# Patient Record
Sex: Male | Born: 1955
Health system: Southern US, Community
[De-identification: ages and names within clinical notes are randomized; demographics above are authoritative.]

## PROBLEM LIST (undated history)

## (undated) DIAGNOSIS — G479 Sleep disorder, unspecified: Secondary | ICD-10-CM

## (undated) DIAGNOSIS — K2270 Barrett's esophagus without dysplasia: Secondary | ICD-10-CM

## (undated) DIAGNOSIS — D126 Benign neoplasm of colon, unspecified: Secondary | ICD-10-CM

## (undated) DIAGNOSIS — B019 Varicella without complication: Secondary | ICD-10-CM

## (undated) DIAGNOSIS — M25569 Pain in unspecified knee: Secondary | ICD-10-CM

## (undated) DIAGNOSIS — K219 Gastro-esophageal reflux disease without esophagitis: Secondary | ICD-10-CM

## (undated) DIAGNOSIS — E669 Obesity, unspecified: Secondary | ICD-10-CM

## (undated) HISTORY — DX: Pain in unspecified knee: M25.569

## (undated) HISTORY — DX: Gastro-esophageal reflux disease without esophagitis: K21.9

## (undated) HISTORY — PX: COLONOSCOPY: SHX174

## (undated) HISTORY — PX: OTHER SURGICAL HISTORY: SHX169

## (undated) HISTORY — DX: Obesity, unspecified: E66.9

## (undated) HISTORY — PX: UPPER GASTROINTESTINAL ENDOSCOPY: SHX188

## (undated) HISTORY — DX: Varicella without complication: B01.9

## (undated) HISTORY — DX: Barrett's esophagus without dysplasia: K22.70

## (undated) HISTORY — PX: WISDOM TOOTH EXTRACTION: SHX21

## (undated) HISTORY — PX: TONSILLECTOMY: SUR1361

## (undated) HISTORY — PX: APPENDECTOMY: SHX54

## (undated) HISTORY — DX: Benign neoplasm of colon, unspecified: D12.6

## (undated) HISTORY — DX: Sleep disorder, unspecified: G47.9

---

## 1993-11-21 HISTORY — PX: VASECTOMY: SHX75

## 2011-07-14 ENCOUNTER — Telehealth: Payer: Self-pay | Admitting: *Deleted

## 2011-07-14 DIAGNOSIS — Z0000 Encounter for general adult medical examination without abnormal findings: Secondary | ICD-10-CM

## 2011-07-14 DIAGNOSIS — Z125 Encounter for screening for malignant neoplasm of prostate: Secondary | ICD-10-CM

## 2011-07-14 NOTE — Telephone Encounter (Signed)
Received staff msg pt schedule for CPX 08/24/11. Need CPX labs entered in EPIC. Entered labs...07/14/11@11 :26am/LMB

## 2011-08-17 ENCOUNTER — Other Ambulatory Visit (INDEPENDENT_AMBULATORY_CARE_PROVIDER_SITE_OTHER): Payer: Self-pay

## 2011-08-17 DIAGNOSIS — Z0000 Encounter for general adult medical examination without abnormal findings: Secondary | ICD-10-CM

## 2011-08-17 DIAGNOSIS — Z125 Encounter for screening for malignant neoplasm of prostate: Secondary | ICD-10-CM

## 2011-08-17 LAB — URINALYSIS, ROUTINE W REFLEX MICROSCOPIC
Bilirubin Urine: NEGATIVE
Hgb urine dipstick: NEGATIVE
Ketones, ur: NEGATIVE
Leukocytes, UA: NEGATIVE
Nitrite: NEGATIVE
Urobilinogen, UA: 0.2 (ref 0.0–1.0)
pH: 6 (ref 5.0–8.0)

## 2011-08-17 LAB — CBC WITH DIFFERENTIAL/PLATELET
Basophils Absolute: 0 10*3/uL (ref 0.0–0.1)
Eosinophils Relative: 2.8 % (ref 0.0–5.0)
Lymphs Abs: 2 10*3/uL (ref 0.7–4.0)
MCV: 96.3 fl (ref 78.0–100.0)
Monocytes Absolute: 0.7 10*3/uL (ref 0.1–1.0)
Monocytes Relative: 7.9 % (ref 3.0–12.0)
Neutrophils Relative %: 66.6 % (ref 43.0–77.0)
Platelets: 181 10*3/uL (ref 150.0–400.0)
RDW: 12.6 % (ref 11.5–14.6)
WBC: 8.7 10*3/uL (ref 4.5–10.5)

## 2011-08-17 LAB — HEPATIC FUNCTION PANEL
ALT: 53 U/L (ref 0–53)
AST: 24 U/L (ref 0–37)
Albumin: 4.1 g/dL (ref 3.5–5.2)
Alkaline Phosphatase: 80 U/L (ref 39–117)

## 2011-08-17 LAB — TSH: TSH: 1.08 u[IU]/mL (ref 0.35–5.50)

## 2011-08-17 LAB — LIPID PANEL
HDL: 43.4 mg/dL (ref >39.00)
Triglycerides: 179 mg/dL — ABNORMAL HIGH (ref 0.0–149.0)

## 2011-08-24 ENCOUNTER — Ambulatory Visit (INDEPENDENT_AMBULATORY_CARE_PROVIDER_SITE_OTHER): Payer: BC Managed Care – PPO | Admitting: Internal Medicine

## 2011-08-24 ENCOUNTER — Encounter: Payer: Self-pay | Admitting: Internal Medicine

## 2011-08-24 ENCOUNTER — Ambulatory Visit (INDEPENDENT_AMBULATORY_CARE_PROVIDER_SITE_OTHER)
Admission: RE | Admit: 2011-08-24 | Discharge: 2011-08-24 | Disposition: A | Discharge status: Home / Self Care | Payer: BC Managed Care – PPO | Source: Ambulatory Visit | Attending: Internal Medicine | Admitting: Internal Medicine

## 2011-08-24 VITALS — BP 122/76 | HR 62 | Temp 98.3°F | Wt 225.0 lb

## 2011-08-24 DIAGNOSIS — E669 Obesity, unspecified: Secondary | ICD-10-CM

## 2011-08-24 DIAGNOSIS — R1310 Dysphagia, unspecified: Secondary | ICD-10-CM

## 2011-08-24 DIAGNOSIS — Z23 Encounter for immunization: Secondary | ICD-10-CM

## 2011-08-24 DIAGNOSIS — Z136 Encounter for screening for cardiovascular disorders: Secondary | ICD-10-CM

## 2011-08-24 DIAGNOSIS — Z87891 Personal history of nicotine dependence: Secondary | ICD-10-CM | POA: Insufficient documentation

## 2011-08-24 DIAGNOSIS — G479 Sleep disorder, unspecified: Secondary | ICD-10-CM

## 2011-08-24 DIAGNOSIS — F17200 Nicotine dependence, unspecified, uncomplicated: Secondary | ICD-10-CM

## 2011-08-24 DIAGNOSIS — Z0000 Encounter for general adult medical examination without abnormal findings: Secondary | ICD-10-CM

## 2011-08-24 DIAGNOSIS — M25569 Pain in unspecified knee: Secondary | ICD-10-CM

## 2011-08-24 MED ORDER — TETANUS-DIPHTH-ACELL PERTUSSIS 5-2.5-18.5 LF-MCG/0.5 IM SUSP: 0.5000 mL | Freq: Once | INTRAMUSCULAR | Status: DC

## 2011-08-24 NOTE — Progress Notes (Signed)
Subjective:    Patient ID: Terrance Ortiz, male    DOB: [birth year], 55 y.o.   MRN: [id number]  HPI Terrance Ortiz is here to reestablish for on-going continuity care. He is doing well, just needs a post-warranty going over. He reports that sleep has been a problem: sleep duration issues. Generally this is due to work related issues.  Past Medical History  Diagnosis Date  . Varicella   . Knee pain     recurrent intermittent tendonitis  . Overweight for pediatric patient   . Sleep disorder    Past Surgical History  Procedure Date  . Fracture collar bone   . Vasectomy 1995   Family History  Problem Relation Age of Onset  . Cancer Father     esophageal cancer-had mets but survived 15 yrs  . Diabetes Neg Hx   . Hypertension Neg Hx   . Hyperlipidemia Neg Hx   . Heart disease Neg Hx    History   Social History  . Marital Status: Married    Spouse Name: N/A    Number of Children: 2  . Years of Education: 16   Occupational History  . real estate    Social History Main Topics  . Smoking status: Former Smoker -- 0.3 packs/day for 20 years    Quit date: 01/11/2011  . Smokeless tobacco: Never Used  . Alcohol Use: 7.5 oz/week    15 drink(s) per week  . Drug Use: No  . Sexually Active: Yes -- Male partner(s)   Other Topics Concern  . 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 BS- Bus Admin. Married - '84. 1 dtr - '94, 1 son '93. 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 Do you drink Alcoholic Beverages-yes. Do you drink caffienated Beverages-no. Do you use seatbelt often-yes. Do you exercise at least 3 times a week-no. Is there a smoke alarm in your house-yes. Have you experienced physical abuse-noUsual # of hours of sleep per night 6-7       Review of Systems Constitutional:  Negative for fever, chills, activity change and unexpected weight change.  HEENT:  Negative for hearing loss, ear pain,  congestion, neck stiffness and postnasal drip. Negative for sore throat or swallowing problems. Negative for dental complaints.   Eyes: Negative for vision loss or change in visual acuity.  Respiratory: Negative for chest tightness and wheezing. Negative for DOE.   Cardiovascular: Negative for chest pain or palpitations. No decreased exercise tolerance Gastrointestinal: No change in bowel habit. No bloating or gas. No reflux or indigestion Genitourinary: Negative for urgency, frequency, flank pain and difficulty urinating.  Musculoskeletal: Negative for myalgias, back pain, arthralgias and gait problem.  Neurological: Negative for dizziness, tremors, weakness and headaches.  Hematological: Negative for adenopathy.  Psychiatric/Behavioral: Negative for behavioral problems and dysphoric mood.       Objective:   Physical Exam Vitals noted and normal. Gen'l- overweight white man in no distress HEENT - [place]/AT, C&S clear, PERRLA, fundi - normal with handheld instrument. Oropharynx with native dentition in good repair, no buccal or palatal lesions, posterior pharynx clear. Neck - supple, no thyromegaly Node - none Chest - Nop CVAT, no deformity Lungs - Clear to exam Cor - 2+ radial and DP pulses, quiet precordium, RRR w/o murmur, rub or gallop. No JVD, no carotid bruits Abdomen - obese, soft, no guarding or rebound, no organomegaly Genitalia deferred Rectal deferred to  normal PSA Ext- no deformity, Moves all extremities normally. Neuro - non focal exam, DTRs 2+ and symmetrical, normal motor strength, normal gait and balance Skin - clear  Lab Results  Component Value Date   WBC 8.7 08/17/2011   HGB 15.9 08/17/2011   HCT 46.9 08/17/2011   PLT 181.0 08/17/2011   CHOL 153 08/17/2011   TRIG 179.0* 08/17/2011   HDL 43.40 08/17/2011   LDLCALC 74 08/17/2011   ALT 53 08/17/2011   AST 24 08/17/2011   TSH 1.08 08/17/2011   PSA 0.42 08/17/2011          Assessment & Plan:

## 2011-08-26 ENCOUNTER — Encounter: Payer: Self-pay | Admitting: Internal Medicine

## 2011-08-26 DIAGNOSIS — E669 Obesity, unspecified: Secondary | ICD-10-CM | POA: Insufficient documentation

## 2011-08-26 DIAGNOSIS — G479 Sleep disorder, unspecified: Secondary | ICD-10-CM | POA: Insufficient documentation

## 2011-08-26 DIAGNOSIS — Z0000 Encounter for general adult medical examination without abnormal findings: Secondary | ICD-10-CM | POA: Insufficient documentation

## 2011-08-26 DIAGNOSIS — R1310 Dysphagia, unspecified: Secondary | ICD-10-CM | POA: Insufficient documentation

## 2011-08-26 DIAGNOSIS — M25569 Pain in unspecified knee: Secondary | ICD-10-CM | POA: Insufficient documentation

## 2011-08-26 NOTE — Assessment & Plan Note (Addendum)
Discussed weight management: smart food choices among existing preferences; PORTION SIZE control - use of the hand as a gauge for meals and snacks, meal of 1,000 chews; regular aerobic exercise - 30 minutes 3 times a week minimum with target heart rate of 130+. Target weight 180 lbs; goal - loose 1-2 lbs per month.

## 2011-08-26 NOTE — Assessment & Plan Note (Addendum)
Recently quit smoking. Doing OK.  Plan - routine follow up chest x-ray  Addendum: Clinical Data: Tobacco abuse  CHEST - 2 VIEW  Comparison: None.  Findings: Normal mediastinum and cardiac silhouette. No effusion,  infiltrate, or pneumothorax no osseous abnormality.  IMPRESSION:  No acute cardiopulmonary process. No radiographic evidence of  pulmonary nodule.  Original Report Authenticated By: Genevive Bi, M.D.

## 2011-08-26 NOTE — Assessment & Plan Note (Signed)
Patient h/o heartburn for which he has been taking prn pepcid. He also has a h/o smoking and drinking and a family h/o esophageal cancer. He has stopped smoking.  Plan - continue smoking cessation           Take H2 blocker, pepcid, twice a day routinely           Referral to Dr. Russella Dar

## 2011-08-26 NOTE — Assessment & Plan Note (Signed)
Sleep duration insomnia due to delay in processing daytime activities. Plan - sleep hygiene approach: regular hours to retire and rise 7 days/week; avoidance of stimulants including sugar, chocolate and alcohol; regular exercise but at least 4 hours before retiring; sleep sanctuary concept; avoidance of extinction behaviors - particularly laying in bed awake; structured contemplative time prior to retiring.

## 2011-08-26 NOTE — Assessment & Plan Note (Signed)
Interval h/o last 8 years unremarkable.His physical exam is normal except for weight. Lab results are in normal range with excellent lipids. He is due for colorectal cancer screening and will be referred to Dr. Russella Dar for consultation for mild dysphagia and need for colorectal cancer screening. PSS was normal - he is advised to have repeat study in 3 years if he chooses to have screening. Due for Tdap. 12 lead EKG is normal with no evidence of ischemia or injury  In summary - a very nice man who is investing in his health with smoking cessation and reduced alcohol intake. He is committed to weight management and increased exercise. He will be referred to GI for evaluation. He is asked to return as needed or in 6 months for follow-up of weight management and sleep disorder.

## 2011-09-21 ENCOUNTER — Encounter: Payer: Self-pay | Admitting: Gastroenterology

## 2011-09-21 ENCOUNTER — Ambulatory Visit (INDEPENDENT_AMBULATORY_CARE_PROVIDER_SITE_OTHER): Payer: BC Managed Care – PPO | Admitting: Gastroenterology

## 2011-09-21 VITALS — BP 120/74 | HR 88 | Ht 71.0 in | Wt 225.0 lb

## 2011-09-21 DIAGNOSIS — K219 Gastro-esophageal reflux disease without esophagitis: Secondary | ICD-10-CM

## 2011-09-21 DIAGNOSIS — Z1211 Encounter for screening for malignant neoplasm of colon: Secondary | ICD-10-CM

## 2011-09-21 MED ORDER — PEG-KCL-NACL-NASULF-NA ASC-C 100 G PO SOLR: 1.0000 | Freq: Once | ORAL | Status: DC

## 2011-09-21 NOTE — Patient Instructions (Signed)
You have been scheduled for a Upper Endoscopy/ Colonoscopy. See separate instructions.  Pick up your prep kit from your pharmacy.  Patient advised to avoid spicy, acidic, citrus, chocolate, mints, fruit and fruit juices.  Limit the intake of caffeine, alcohol and Soda.  Don't exercise too soon after eating.  Don't lie down within 3-4 hours of eating.  Elevate the head of your bed. cc: Illene Regulus, MD

## 2011-09-21 NOTE — Progress Notes (Signed)
History of Present Illness: This is a 55 year old male relates intermittent reflux symptoms for the past few years. He states symptoms are brought on by certain foods and generally respond well to Pepcid AC. He was advised to use Pepcid AC 2 daily on a regular basis to prevent reflux but has not done so since his symptoms only occur once every week or two. He states that occasionally when he is eating very fast his food will go down slowly. Denies weight loss, abdominal pain, constipation, diarrhea, change in stool caliber, melena, hematochezia, nausea, vomiting, chest pain.  Review of Systems: Pertinent positive and negative review of systems were noted in the above HPI section. All other review of systems were otherwise negative.  Current Medications, Allergies, Past Medical History, Past Surgical History, Family History and Social History were reviewed in [employer] record.  Physical Exam: General: Well developed , well nourished, no acute distress Head: Normocephalic and atraumatic Eyes:  sclerae anicteric, EOMI Ears: Normal auditory acuity Mouth: No deformity or lesions Neck: Supple, no masses or thyromegaly Lungs: Clear throughout to auscultation Heart: Regular rate and rhythm; no murmurs, rubs or bruits Abdomen: Soft, non tender and non distended. No masses, hepatosplenomegaly or hernias noted. Normal Bowel sounds Rectal: Deferred  Musculoskeletal: Symmetrical with no gross deformities  Skin: No lesions on visible extremities Pulses:  Normal pulses noted Extremities: No clubbing, cyanosis, edema or deformities noted Neurological: Alert oriented x 4, grossly nonfocal Cervical Nodes:  No significant cervical adenopathy Inguinal Nodes: No significant inguinal adenopathy Psychological:  Alert and cooperative. Normal mood and affect  Assessment and Recommendations:  1. GERD. Symptoms are intermittent. He has possible dysphagia symptoms as well. Begin all standard  antireflux measures and continue Pepcid AC 1-2 daily as needed. Rule out erosive esophagitis and Barrett's esophagus. The risks, benefits, and alternatives to endoscopy with possible biopsy and possible dilation were discussed with the patient and they consent to proceed.   2. Colorectal cancer screening. The risks, benefits, and alternatives to colonoscopy with possible biopsy and possible polypectomy were discussed with the patient and they consent to proceed.

## 2011-09-22 DIAGNOSIS — D126 Benign neoplasm of colon, unspecified: Secondary | ICD-10-CM

## 2011-09-22 HISTORY — DX: Benign neoplasm of colon, unspecified: D12.6

## 2011-10-17 ENCOUNTER — Ambulatory Visit (AMBULATORY_SURGERY_CENTER): Payer: BC Managed Care – PPO | Admitting: Gastroenterology

## 2011-10-17 ENCOUNTER — Encounter: Payer: Self-pay | Admitting: Gastroenterology

## 2011-10-17 DIAGNOSIS — K219 Gastro-esophageal reflux disease without esophagitis: Secondary | ICD-10-CM

## 2011-10-17 DIAGNOSIS — Z1211 Encounter for screening for malignant neoplasm of colon: Secondary | ICD-10-CM

## 2011-10-17 DIAGNOSIS — D126 Benign neoplasm of colon, unspecified: Secondary | ICD-10-CM

## 2011-10-17 DIAGNOSIS — K2270 Barrett's esophagus without dysplasia: Secondary | ICD-10-CM

## 2011-10-17 MED ORDER — SODIUM CHLORIDE 0.9 % IV SOLN: 500.0000 mL | INTRAVENOUS | Status: DC

## 2011-10-17 NOTE — Progress Notes (Signed)
Patient did not experience any of the following events: a burn prior to discharge; a fall within the facility; wrong site/side/patient/procedure/implant event; or a hospital transfer or hospital admission upon discharge from the facility. (G8907) Patient did not have preoperative order for IV antibiotic SSI prophylaxis. (G8918)  

## 2011-10-17 NOTE — Patient Instructions (Signed)
Please read the handouts given to you by the your recovery room nurse.   Your polyp reports will be mailed to you within two weeks.  You may resume your routine medications today.  Please call us if you have any questions at [phone]. Thank-you.

## 2011-10-18 ENCOUNTER — Telehealth: Payer: Self-pay | Admitting: *Deleted

## 2011-10-18 NOTE — Telephone Encounter (Signed)
Left message on number given in admitting yest with the ok to leave a message, ewm

## 2011-10-27 ENCOUNTER — Encounter: Payer: Self-pay | Admitting: Gastroenterology

## 2011-11-04 ENCOUNTER — Telehealth: Payer: Self-pay | Admitting: Gastroenterology

## 2011-11-04 MED ORDER — OMEPRAZOLE 20 MG PO CPDR: 20.0000 mg | DELAYED_RELEASE_CAPSULE | Freq: Every day | ORAL | Status: DC

## 2011-11-04 NOTE — Telephone Encounter (Signed)
I have reviewed with the patient the results of EGD.  He reports he does not have omeprazole rx as stated on report.  I have sent an rx to his pharmacy.

## 2012-12-30 ENCOUNTER — Other Ambulatory Visit: Payer: Self-pay | Admitting: Gastroenterology

## 2013-01-04 ENCOUNTER — Other Ambulatory Visit: Payer: Self-pay | Admitting: Gastroenterology

## 2013-01-10 ENCOUNTER — Other Ambulatory Visit: Payer: Self-pay | Admitting: Gastroenterology

## 2013-01-14 ENCOUNTER — Other Ambulatory Visit: Payer: Self-pay

## 2013-01-14 ENCOUNTER — Telehealth: Payer: Self-pay | Admitting: Gastroenterology

## 2013-01-14 MED ORDER — OMEPRAZOLE 20 MG PO CPDR: 20.0000 mg | DELAYED_RELEASE_CAPSULE | Freq: Every day | ORAL | Status: DC

## 2013-01-14 NOTE — Telephone Encounter (Signed)
Patient has not been seen since 09/2011. Left a message for patient letting him know he can get this medication through his PCP or he can get the medication over the counter until the scheduled appt.

## 2013-01-21 ENCOUNTER — Ambulatory Visit: Payer: BC Managed Care – PPO | Admitting: Gastroenterology

## 2013-01-28 ENCOUNTER — Ambulatory Visit: Payer: BC Managed Care – PPO | Admitting: Gastroenterology

## 2013-02-11 ENCOUNTER — Ambulatory Visit: Payer: BC Managed Care – PPO | Admitting: Gastroenterology

## 2013-02-13 ENCOUNTER — Ambulatory Visit (INDEPENDENT_AMBULATORY_CARE_PROVIDER_SITE_OTHER): Payer: BC Managed Care – PPO | Admitting: Gastroenterology

## 2013-02-13 ENCOUNTER — Encounter: Payer: Self-pay | Admitting: Gastroenterology

## 2013-02-13 VITALS — BP 118/70 | HR 79 | Ht 71.0 in | Wt 230.0 lb

## 2013-02-13 DIAGNOSIS — K2270 Barrett's esophagus without dysplasia: Secondary | ICD-10-CM

## 2013-02-13 DIAGNOSIS — Z860100 Personal history of colon polyps, unspecified: Secondary | ICD-10-CM

## 2013-02-13 MED ORDER — OMEPRAZOLE 20 MG PO CPDR: 20.0000 mg | DELAYED_RELEASE_CAPSULE | Freq: Every day | ORAL | Status: DC

## 2013-02-13 NOTE — Progress Notes (Signed)
History of Present Illness: This is a 57 year old male with a history of a short segment of Barrett's esophagus and adenomatous colon polyps. His reflux symptoms are under complete control on omeprazole 20 mg daily. He has no gastrointestinal complaints. Denies weight loss, abdominal pain, constipation, diarrhea, change in stool caliber, melena, hematochezia, nausea, vomiting, dysphagia, reflux symptoms, chest pain.  Current Medications, Allergies, Past Medical History, Past Surgical History, Family History and Social History were reviewed in [employer] record.  Physical Exam: General: Well developed , well nourished, no acute distress Head: Normocephalic and atraumatic Eyes:  sclerae anicteric, EOMI Ears: Normal auditory acuity Mouth: No deformity or lesions Lungs: Clear throughout to auscultation Heart: Regular rate and rhythm; no murmurs, rubs or bruits Abdomen: Soft, non tender and non distended. No masses, hepatosplenomegaly or hernias noted. Normal Bowel sounds Musculoskeletal: Symmetrical with no gross deformities  Pulses:  Normal pulses noted Extremities: No clubbing, cyanosis, edema or deformities noted Neurological: Alert oriented x 4, grossly nonfocal Psychological:  Alert and cooperative. Normal mood and affect  Assessment and Recommendations:  1. Barretts esophagus, short segment. Continue standard antireflux measures long-term and omeprazole 20 mg daily long-term. Surveillance endoscopy recommended November 2015.  2. Personal history of adenomatous colon polyps. Surveillance colonoscopy recommended November 2017.

## 2013-02-13 NOTE — Patient Instructions (Addendum)
We have sent the following medications to your pharmacy for you to pick up at your convenience: Omeprazole   Thank you for choosing Dr. Russella Dar and [facility]

## 2013-10-24 ENCOUNTER — Ambulatory Visit (INDEPENDENT_AMBULATORY_CARE_PROVIDER_SITE_OTHER): Payer: BC Managed Care – PPO | Admitting: Internal Medicine

## 2013-10-24 ENCOUNTER — Ambulatory Visit (INDEPENDENT_AMBULATORY_CARE_PROVIDER_SITE_OTHER)
Admission: RE | Admit: 2013-10-24 | Discharge: 2013-10-24 | Disposition: A | Discharge status: Home / Self Care | Payer: BC Managed Care – PPO | Source: Ambulatory Visit | Attending: Internal Medicine | Admitting: Internal Medicine

## 2013-10-24 ENCOUNTER — Other Ambulatory Visit (INDEPENDENT_AMBULATORY_CARE_PROVIDER_SITE_OTHER): Payer: BC Managed Care – PPO

## 2013-10-24 ENCOUNTER — Encounter: Payer: Self-pay | Admitting: Internal Medicine

## 2013-10-24 VITALS — BP 128/82 | HR 63 | Temp 98.6°F | Ht 71.0 in | Wt 230.0 lb

## 2013-10-24 DIAGNOSIS — E669 Obesity, unspecified: Secondary | ICD-10-CM

## 2013-10-24 DIAGNOSIS — Z0000 Encounter for general adult medical examination without abnormal findings: Secondary | ICD-10-CM

## 2013-10-24 DIAGNOSIS — G479 Sleep disorder, unspecified: Secondary | ICD-10-CM

## 2013-10-24 DIAGNOSIS — Z87891 Personal history of nicotine dependence: Secondary | ICD-10-CM

## 2013-10-24 DIAGNOSIS — F17200 Nicotine dependence, unspecified, uncomplicated: Secondary | ICD-10-CM

## 2013-10-24 LAB — COMPREHENSIVE METABOLIC PANEL
ALT: 74 U/L — ABNORMAL HIGH (ref 0–53)
Alkaline Phosphatase: 64 U/L (ref 39–117)
Creatinine, Ser: 0.8 mg/dL (ref 0.4–1.5)
GFR: 104.78 mL/min (ref >60.00)
Sodium: 139 mEq/L (ref 135–145)
Total Bilirubin: 0.9 mg/dL (ref 0.3–1.2)
Total Protein: 7.2 g/dL (ref 6.0–8.3)

## 2013-10-24 MED ORDER — OMEPRAZOLE 20 MG PO CPDR: 20.0000 mg | DELAYED_RELEASE_CAPSULE | Freq: Every day | ORAL | Status: DC

## 2013-10-24 NOTE — Progress Notes (Signed)
Subjective:    Patient ID: Terrance Ortiz, male    DOB: 22-Oct-1956, 57 y.o.   MRN: [id number]  HPI Terrance Ortiz presents for a general wellness exam. No major illness or surgery, no injuries. He has a "cyst" on the plantar aspect of the left foot at the in-step.   He is current with the dentist; has had eye exam about two years ago. He does exercise - 20 minute work 3 times a week. Healthy diet. Business is better. Family is good.   Past Medical History  Diagnosis Date  . Varicella   . Knee pain     recurrent intermittent tendonitis  . Obesity   . Sleep disorder   . GERD (gastroesophageal reflux disease)   . Barrett's esophagus   . Tubular adenoma of colon 09/2011   Past Surgical History  Procedure Laterality Date  . Fracture collar bone    . Vasectomy  1995   Family History  Problem Relation Age of Onset  . Cancer Father     esophageal cancer-had mets but survived 15 yrs  . Diabetes Neg Hx   . Hypertension Neg Hx   . Hyperlipidemia Neg Hx   . Heart disease Neg Hx   . Colon cancer Neg Hx    History   Social History  . Marital Status: Married    Spouse Name: N/A    Number of Children: 2  . Years of Education: 16   Occupational History  . Real Estate     Social History Main Topics  . Smoking status: Former Smoker -- 0.30 packs/day for 20 years    Quit date: 01/11/2011  . Smokeless tobacco: Never Used  . Alcohol Use: 0.0 oz/week     Comment: case of beer a week   . Drug Use: No  . Sexual Activity: Yes    Partners: Female   Other Topics Concern  . 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 BS- Bus Admin. Married - '84. 1 dtr - '94, 1 son '93. 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             Do you drink Alcoholic Beverages-yes. Do you drink caffienated Beverages-no. Do you use seatbelt often-yes. Do you exercise at least 3 times a week-no. Is there a smoke alarm in your  house-yes. Have you experienced physical abuse-no         Usual # of hours of sleep per night 6-7                            Current Outpatient Prescriptions on File Prior to Visit  Medication Sig Dispense Refill  . Naproxen Sodium (ALEVE PO) Take by mouth as needed.        Marland Kitchen omeprazole (PRILOSEC) 20 MG capsule Take 1 capsule (20 mg total) by mouth daily.  30 capsule  11   No current facility-administered medications on file prior to visit.      Review of Systems Constitutional:  Negative for fever, chills, activity change and unexpected weight change.  HEENT:  Negative for hearing loss, ear pain, congestion, neck stiffness and postnasal drip. Negative for sore throat or swallowing problems. Negative for dental complaints.   Eyes: Negative for vision loss or change in visual acuity.  Respiratory: Negative for chest tightness and wheezing. Negative for DOE.   Cardiovascular: Negative  for chest pain or palpitations. No decreased exercise tolerance Gastrointestinal: No change in bowel habit. No bloating or gas. No reflux or indigestion Genitourinary: Negative for urgency, frequency, flank pain and difficulty urinating.  Musculoskeletal: Negative for myalgias, back pain, arthralgias and gait problem.  Neurological: Negative for dizziness, tremors, weakness and headaches.  Hematological: Negative for adenopathy.  Psychiatric/Behavioral: Negative for behavioral problems and dysphoric mood.       Objective:   Physical Exam        Assessment & Plan:

## 2013-10-24 NOTE — Progress Notes (Signed)
Pre visit review using our clinic review tool, if applicable. No additional management support is needed unless otherwise documented below in the visit note. 

## 2013-10-24 NOTE — Patient Instructions (Signed)
Good to see you. [place] didn't seem to hurt your health!!  Your exam is normal except you could loose a few pounds: Diet management: smart food choices, PORTION SIZE CONTROL, regular exercise. Goal - to loose 1-2 lbs.month. Target weight - 190-200lbs.  Will check blood sugar and renal function today - results will be posted to MyChart. Will also check chest x-ray as a former smoker.  You should see an eye doctor. You should have a follow - up exam in two years.   As I go to barging will put you down to see Dr. Santa Genera.

## 2013-10-26 NOTE — Assessment & Plan Note (Signed)
Taking no medications and doing relatively well, better since the economy has taken an upturn.

## 2013-10-26 NOTE — Assessment & Plan Note (Signed)
Interval history is benign. Physical exam is normal except for mild overweight. Labs reviewed - normal, including A1C 5.6%. Last lipid panel Sept '12 - TC 153 HDL 43 LDL 74. He is current with colorectal cancer screening. Discussed pros and cons of prostate cancer screening (USPHCTF recommendations reviewed and ACU April '13 recommendations) and he defers evaluation at this time with last PSA = 0.42 Sept '12. Immunization is up to date.  In summary - a healthy appearing man who will work on [employer] - he knows that portion size is key. He will return for CPX in 2 years otherwise as needed.

## 2013-10-26 NOTE — Assessment & Plan Note (Addendum)
Diet management: smart food choices, PORTION SIZE CONTROL, regular exercise. Goal - to loose 1-2 lbs.month. Target weight - 200 lbs: 30 lb loss over 2 years.

## 2013-10-26 NOTE — Assessment & Plan Note (Signed)
Doing well except for the rare cigar.

## 2014-07-29 ENCOUNTER — Encounter: Payer: Self-pay | Admitting: Gastroenterology

## 2014-08-22 ENCOUNTER — Encounter: Payer: Self-pay | Admitting: Gastroenterology

## 2014-10-07 ENCOUNTER — Ambulatory Visit (AMBULATORY_SURGERY_CENTER): Payer: Self-pay | Admitting: *Deleted

## 2014-10-07 VITALS — Ht 71.0 in | Wt 217.0 lb

## 2014-10-07 DIAGNOSIS — K2270 Barrett's esophagus without dysplasia: Secondary | ICD-10-CM

## 2014-10-07 NOTE — Progress Notes (Signed)
No egg or soy allergy  No anesthesia or intubation problems per pt  No diet medications taken   

## 2014-10-14 ENCOUNTER — Encounter: Payer: Self-pay | Admitting: Gastroenterology

## 2014-10-21 ENCOUNTER — Encounter: Payer: BC Managed Care – PPO | Admitting: Gastroenterology

## 2014-10-30 ENCOUNTER — Ambulatory Visit (AMBULATORY_SURGERY_CENTER): Payer: BC Managed Care – PPO | Admitting: Gastroenterology

## 2014-10-30 ENCOUNTER — Encounter: Payer: Self-pay | Admitting: Gastroenterology

## 2014-10-30 VITALS — BP 136/89 | HR 68 | Temp 96.3°F | Resp 20 | Ht 71.0 in | Wt 217.0 lb

## 2014-10-30 DIAGNOSIS — K2270 Barrett's esophagus without dysplasia: Secondary | ICD-10-CM

## 2014-10-30 MED ORDER — SODIUM CHLORIDE 0.9 % IV SOLN: 500.0000 mL | INTRAVENOUS | Status: DC

## 2014-10-30 MED ORDER — OMEPRAZOLE 20 MG PO CPDR
20.0000 mg | DELAYED_RELEASE_CAPSULE | Freq: Every day | ORAL | Status: DC
Start: 2014-10-30 — End: 2014-12-09

## 2014-10-30 NOTE — Patient Instructions (Signed)
YOU HAD AN ENDOSCOPIC PROCEDURE TODAY AT THE [facility]: Refer to the procedure report that was given to you for any specific questions about what was found during the examination.  If the procedure report does not answer your questions, please call your gastroenterologist to clarify.  If you requested that your care partner not be given the details of your procedure findings, then the procedure report has been included in a sealed envelope for you to review at your convenience later.  YOU SHOULD EXPECT: Some feelings of bloating in the abdomen. Passage of more gas than usual.  Walking can help get rid of the air that was put into your GI tract during the procedure and reduce the bloating. If you had a lower endoscopy (such as a colonoscopy or flexible sigmoidoscopy) you may notice spotting of blood in your stool or on the toilet paper. If you underwent a bowel prep for your procedure, then you may not have a normal bowel movement for a few days.  DIET: Your first meal following the procedure should be a light meal and then it is ok to progress to your normal diet.  A half-sandwich or bowl of soup is an example of a good first meal.  Heavy or fried foods are harder to digest and may make you feel nauseous or bloated.  Likewise meals heavy in dairy and vegetables can cause extra gas to form and this can also increase the bloating.  Drink plenty of fluids but you should avoid alcoholic beverages for 24 hours.  ACTIVITY: Your care partner should take you home directly after the procedure.  You should plan to take it easy, moving slowly for the rest of the day.  You can resume normal activity the day after the procedure however you should NOT DRIVE or use heavy machinery for 24 hours (because of the sedation medicines used during the test).    SYMPTOMS TO REPORT IMMEDIATELY: A gastroenterologist can be reached at any hour.  During normal business hours, 8:30 AM to 5:00 PM Monday through Friday,  call [phone].  After hours and on weekends, please call the GI answering service at [phone] who will take a message and have the physician on call contact you.    Following upper endoscopy (EGD)  Vomiting of blood or coffee ground material  New chest pain or pain under the shoulder blades  Painful or persistently difficult swallowing  New shortness of breath  Fever of 100F or higher  Black, tarry-looking stools  FOLLOW UP: If any biopsies were taken you will be contacted by phone or by letter within the next 1-3 weeks.  Call your gastroenterologist if you have not heard about the biopsies in 3 weeks.  Our staff will call the home number listed on your records the next business day following your procedure to check on you and address any questions or concerns that you may have at that time regarding the information given to you following your procedure. This is a courtesy call and so if there is no answer at the home number and we have not heard from you through the emergency physician on call, we will assume that you have returned to your regular daily activities without incident.  SIGNATURES/CONFIDENTIALITY: You and/or your care partner have signed paperwork which will be entered into your electronic medical record.  These signatures attest to the fact that that the information above on your After Visit Summary has been reviewed and is understood.  Full   responsibility of the confidentiality of this discharge information lies with you and/or your care-partner.   Resume medications. Information given on  [facility]rett's,[place] with discharge instructions.

## 2014-10-30 NOTE — Op Note (Signed)
[place]  [address] [place], [id number]   ENDOSCOPY PROCEDURE REPORT  PATIENT: Terrance Ortiz, Terrance Ortiz  MR#: [id number] BIRTHDATE: 04/10/1956 , 14  yrs. old GENDER: male ENDOSCOPIST: Ladene Artist, MD, [facility] PROCEDURE DATE:  10/30/2014 PROCEDURE:  EGD w/ biopsy ASA CLASS:     Class II INDICATIONS:  history of Barrett's esophagus. MEDICATIONS: Monitored anesthesia care and Propofol 250 mg IV TOPICAL ANESTHETIC: none DESCRIPTION OF PROCEDURE: After the risks benefits and alternatives of the procedure were thoroughly explained, informed consent was obtained.  The LB [id number] endoscope was introduced through the mouth and advanced to the second portion of the duodenum , Without limitations.  The instrument was slowly withdrawn as the mucosa was fully examined.    ESOPHAGUS: There was a 2cm segment of Barrett's esophagus without dysplasia found in the distal esophagus.  The length of circumferential Barrett's was 1cm ([place] C1).  The length of the Barrett's total was 2 cm ([place] M2). Surveillance (4 quadrant biopsies).   The esophagus was otherwise normal. STOMACH: The mucosa and folds of the stomach appeared normal. DUODENUM: The duodenal mucosa showed no abnormalities in the bulb and 2nd part of the duodenum.  Retroflexed views revealed a small hiatal hernia.     The scope was then withdrawn from the patient and the procedure completed.  COMPLICATIONS: There were no immediate complications.  ENDOSCOPIC IMPRESSION: 1.   Barrett's esophagus in the distal esophagus 2.   Small hiatal hernia 3.   The EGD otherwise appeared normal  RECOMMENDATIONS: 1.  Anti-reflux regimen 2.  Await pathology results 3.  Continue PPI 4.  Repeat EGD in 3 years if no dysplasia  eSigned:  Ladene Artist, MD, [facility] 2014-10-30 03:00:92.330

## 2014-10-30 NOTE — Progress Notes (Signed)
Called to room to assist during endoscopic procedure.  Patient ID and intended procedure confirmed with present staff. Received instructions for my participation in the procedure from the performing physician.  

## 2014-10-30 NOTE — Progress Notes (Signed)
Patient awakening,vss,report to rn 

## 2014-10-31 ENCOUNTER — Telehealth: Payer: Self-pay | Admitting: *Deleted

## 2014-10-31 NOTE — Telephone Encounter (Signed)
No answer, left message to call if questions or concerns. 

## 2014-11-04 ENCOUNTER — Encounter: Payer: Self-pay | Admitting: Gastroenterology

## 2014-11-17 ENCOUNTER — Other Ambulatory Visit: Payer: Self-pay | Admitting: Geriatric Medicine

## 2014-11-17 MED ORDER — OMEPRAZOLE 20 MG PO CPDR: 20.0000 mg | DELAYED_RELEASE_CAPSULE | Freq: Every day | ORAL | Status: DC

## 2014-12-09 ENCOUNTER — Ambulatory Visit (INDEPENDENT_AMBULATORY_CARE_PROVIDER_SITE_OTHER): Payer: BLUE CROSS/BLUE SHIELD | Admitting: Internal Medicine

## 2014-12-09 ENCOUNTER — Encounter: Payer: Self-pay | Admitting: Internal Medicine

## 2014-12-09 VITALS — BP 140/80 | HR 75 | Temp 98.2°F | Resp 14 | Ht 71.0 in | Wt 224.0 lb

## 2014-12-09 DIAGNOSIS — M25579 Pain in unspecified ankle and joints of unspecified foot: Secondary | ICD-10-CM

## 2014-12-09 DIAGNOSIS — K2270 Barrett's esophagus without dysplasia: Secondary | ICD-10-CM | POA: Insufficient documentation

## 2014-12-09 DIAGNOSIS — Z0000 Encounter for general adult medical examination without abnormal findings: Secondary | ICD-10-CM

## 2014-12-09 DIAGNOSIS — Z87891 Personal history of nicotine dependence: Secondary | ICD-10-CM

## 2014-12-09 DIAGNOSIS — M79673 Pain in unspecified foot: Secondary | ICD-10-CM

## 2014-12-09 DIAGNOSIS — E669 Obesity, unspecified: Secondary | ICD-10-CM

## 2014-12-09 NOTE — Assessment & Plan Note (Signed)
Taking omeprazole daily, due repeat EGD 2018, no dysplasia last EGD.

## 2014-12-09 NOTE — Assessment & Plan Note (Signed)
Feel nodule on the bottom of foot and will refer to podiatry for evaluation and treatment.

## 2014-12-09 NOTE — Assessment & Plan Note (Signed)
Quit about 3-4 years ago and has likely >30 pack year history. Will obtain CT low dose chest for screening per his request. We did talk about the possibility of false positive and false negative.

## 2014-12-09 NOTE — Assessment & Plan Note (Signed)
Declines flu shot, due repeat colonoscopy 2017, EGD in 2018. Tdap up to date.

## 2014-12-09 NOTE — Progress Notes (Signed)
   Subjective:    Patient ID: Terrance Ortiz, male    DOB: 05-21-1956, 59 y.o.   MRN: [id number]  HPI The patient is a 59 YO man who is coming in to establish care. He has PMH of obesity, past smoking, GERD.  He recently had follow up of his barrett's esophagus without changes. He has been trying to lose weight and has lost about 20 pounds this last year. He stopped drinking as much beer, increased his exercise, and has been watching his food. He would like to get his foot looked at since it is hurting him with the increased walking. He would like to get a CT chest to screen for lung cancer since several of his friends have been diagnosed with cancer. He states that he quit in 2012 and had been smoking more and less for about 30-40 years. He does likely have at least 30 pack years. He denies chest pains, SOB, abdominal pains.   Review of Systems  Constitutional: Positive for activity change. Negative for fever, chills, appetite change, fatigue and unexpected weight change.       Walking more  HENT: Negative.   Respiratory: Negative for cough, chest tightness, shortness of breath and wheezing.   Cardiovascular: Negative for chest pain, palpitations and leg swelling.  Gastrointestinal: Negative for abdominal pain, diarrhea, constipation and abdominal distention.  Musculoskeletal: Positive for arthralgias. Negative for myalgias and back pain.  Skin: Negative.   Neurological: Negative for dizziness, weakness, light-headedness and headaches.  Psychiatric/Behavioral: Negative.       Objective:   Physical Exam  Constitutional: He is oriented to person, place, and time. He appears well-developed and well-nourished.  overweight  HENT:  Head: Normocephalic and atraumatic.  Eyes: EOM are normal.  Neck: Normal range of motion.  Cardiovascular: Normal rate and regular rhythm.   Pulmonary/Chest: Effort normal and breath sounds normal. No respiratory distress. He has no wheezes. He has no rales.    Abdominal: Soft. Bowel sounds are normal. He exhibits no distension. There is no tenderness. There is no rebound.  Musculoskeletal: He exhibits no edema.  Neurological: He is alert and oriented to person, place, and time. Coordination normal.  Skin: Skin is warm and dry.    Filed Vitals:   12/09/14 0852  BP: 140/80  Pulse: 75  Temp: 98.2 F (36.8 C)  TempSrc: Oral  Resp: 14  Height: 5\' 11"  (1.803 m)  Weight: 224 lb (101.606 kg)  SpO2: 98%      Assessment & Plan:

## 2014-12-09 NOTE — Patient Instructions (Signed)
We will get a low dose CT scan of the chest to check for any problems. We will send you to a foot doctor to look at your foot.   We are not going to check any blood work today.  Keep up the good work with diet and exercise to lose weight. Every 1 pound lost takes 4 pounds of pressure off your knees.   Health Maintenance A healthy lifestyle and preventative care can promote health and wellness.  Maintain regular health, dental, and eye exams.  Eat a healthy diet. Foods like vegetables, fruits, whole grains, low-fat dairy products, and lean protein foods contain the nutrients you need and are low in calories. Decrease your intake of foods high in solid fats, added sugars, and salt. Get information about a proper diet from your health care provider, if necessary.  Regular physical exercise is one of the most important things you can do for your health. Most adults should get at least 150 minutes of moderate-intensity exercise (any activity that increases your heart rate and causes you to sweat) each week. In addition, most adults need muscle-strengthening exercises on 2 or more days a week.   Maintain a healthy weight. The body mass index (BMI) is a screening tool to identify possible weight problems. It provides an estimate of body fat based on height and weight. Your health care provider can find your BMI and can help you achieve or maintain a healthy weight. For males 20 years and older:  A BMI below 18.5 is considered underweight.  A BMI of 18.5 to 24.9 is normal.  A BMI of 25 to 29.9 is considered overweight.  A BMI of 30 and above is considered obese.  Maintain normal blood lipids and cholesterol by exercising and minimizing your intake of saturated fat. Eat a balanced diet with plenty of fruits and vegetables. Blood tests for lipids and cholesterol should begin at age 108 and be repeated every 5 years. If your lipid or cholesterol levels are high, you are over age 7, or you are at high  risk for heart disease, you may need your cholesterol levels checked more frequently.Ongoing high lipid and cholesterol levels should be treated with medicines if diet and exercise are not working.  If you smoke, find out from your health care provider how to quit. If you do not use tobacco, do not start.  Lung cancer screening is recommended for adults aged 40-80 years who are at high risk for developing lung cancer because of a history of smoking. A yearly low-dose CT scan of the lungs is recommended for people who have at least a 30-pack-year history of smoking and are current smokers or have quit within the past 15 years. A pack year of smoking is smoking an average of 1 pack of cigarettes a day for 1 year (for example, a 30-pack-year history of smoking could mean smoking 1 pack a day for 30 years or 2 packs a day for 15 years). Yearly screening should continue until the smoker has stopped smoking for at least 15 years. Yearly screening should be stopped for people who develop a health problem that would prevent them from having lung cancer treatment.  If you choose to drink alcohol, do not have more than 2 drinks per day. One drink is considered to be 12 oz (360 mL) of beer, 5 oz (150 mL) of wine, or 1.5 oz (45 mL) of liquor.  Avoid the use of street drugs. Do not share needles with anyone.  Ask for help if you need support or instructions about stopping the use of drugs.  High blood pressure causes heart disease and increases the risk of stroke. Blood pressure should be checked at least every 1-2 years. Ongoing high blood pressure should be treated with medicines if weight loss and exercise are not effective.  If you are 52-11 years old, ask your health care provider if you should take aspirin to prevent heart disease.  Diabetes screening involves taking a blood sample to check your fasting blood sugar level. This should be done once every 3 years after age 82 if you are at a normal weight and  without risk factors for diabetes. Testing should be considered at a younger age or be carried out more frequently if you are overweight and have at least 1 risk factor for diabetes.  Colorectal cancer can be detected and often prevented. Most routine colorectal cancer screening begins at the age of 25 and continues through age 75. However, your health care provider may recommend screening at an earlier age if you have risk factors for colon cancer. On a yearly basis, your health care provider may provide home test kits to check for hidden blood in the stool. A small camera at the end of a tube may be used to directly examine the colon (sigmoidoscopy or colonoscopy) to detect the earliest forms of colorectal cancer. Talk to your health care provider about this at age 70 when routine screening begins. A direct exam of the colon should be repeated every 5-10 years through age 66, unless early forms of precancerous polyps or small growths are found.  People who are at an increased risk for hepatitis B should be screened for this virus. You are considered at high risk for hepatitis B if:  You were born in a country where hepatitis B occurs often. Talk with your health care provider about which countries are considered high risk.  Your parents were born in a high-risk country and you have not received a shot to protect against hepatitis B (hepatitis B vaccine).  You have HIV or AIDS.  You use needles to inject street drugs.  You live with, or have sex with, someone who has hepatitis B.  You are a man who has sex with other men (MSM).  You get hemodialysis treatment.  You take certain medicines for conditions like cancer, organ transplantation, and autoimmune conditions.  Hepatitis C blood testing is recommended for all people born from [birth year] through 1965 and any individual with known risk factors for hepatitis C.  Healthy men should no longer receive prostate-specific antigen (PSA) blood tests as  part of routine cancer screening. Talk to your health care provider about prostate cancer screening.  Testicular cancer screening is not recommended for adolescents or adult males who have no symptoms. Screening includes self-exam, a health care provider exam, and other screening tests. Consult with your health care provider about any symptoms you have or any concerns you have about testicular cancer.  Practice safe sex. Use condoms and avoid high-risk sexual practices to reduce the spread of sexually transmitted infections (STIs).  You should be screened for STIs, including gonorrhea and chlamydia if:  You are sexually active and are younger than 24 years.  You are older than 24 years, and your health care provider tells you that you are at risk for this type of infection.  Your sexual activity has changed since you were last screened, and you are at an increased risk for chlamydia  or gonorrhea. Ask your health care provider if you are at risk.  If you are at risk of being infected with HIV, it is recommended that you take a prescription medicine daily to prevent HIV infection. This is called pre-exposure prophylaxis (PrEP). You are considered at risk if:  You are a man who has sex with other men (MSM).  You are a heterosexual man who is sexually active with multiple partners.  You take drugs by injection.  You are sexually active with a partner who has HIV.  Talk with your health care provider about whether you are at high risk of being infected with HIV. If you choose to begin PrEP, you should first be tested for HIV. You should then be tested every 3 months for as long as you are taking PrEP.  Use sunscreen. Apply sunscreen liberally and repeatedly throughout the day. You should seek shade when your shadow is shorter than you. Protect yourself by wearing long sleeves, pants, a wide-brimmed hat, and sunglasses year round whenever you are outdoors.  Tell your health care provider of new  moles or changes in moles, especially if there is a change in shape or color. Also, tell your health care provider if a mole is larger than the size of a pencil eraser.  A one-time screening for abdominal aortic aneurysm (AAA) and surgical repair of large AAAs by ultrasound is recommended for men aged 41-75 years who are current or former smokers.  Stay current with your vaccines (immunizations). Document Released: 05/05/2008 Document Revised: 11/12/2013 Document Reviewed: 04/04/2011 [facility] Patient Information 2015 [place], [place]. This information is not intended to replace advice given to you by your health care provider. Make sure you discuss any questions you have with your health care provider.

## 2014-12-09 NOTE — Assessment & Plan Note (Signed)
He is working on weight loss and states he has lost about 20 pounds over the last year.

## 2014-12-09 NOTE — Progress Notes (Signed)
Pre visit review using our clinic review tool, if applicable. No additional management support is needed unless otherwise documented below in the visit note. 

## 2014-12-17 ENCOUNTER — Ambulatory Visit (INDEPENDENT_AMBULATORY_CARE_PROVIDER_SITE_OTHER): Payer: BLUE CROSS/BLUE SHIELD | Admitting: Podiatry

## 2014-12-17 ENCOUNTER — Encounter: Payer: Self-pay | Admitting: Podiatry

## 2014-12-17 VITALS — BP 123/84 | HR 68 | Resp 12

## 2014-12-17 DIAGNOSIS — M722 Plantar fascial fibromatosis: Secondary | ICD-10-CM

## 2014-12-17 DIAGNOSIS — L600 Ingrowing nail: Secondary | ICD-10-CM

## 2014-12-17 NOTE — Progress Notes (Signed)
   Subjective:    Patient ID: Terrance Ortiz, male    DOB: 08-16-1956, 59 y.o.   MRN: [id number]  HPI  N-SORE L-B/L 1ST, 2ND TOENAIL D-6 MONTHS O-SLOWLY C-SAME A-PRESSURE T-TRIM  N-TENDER L-LT FOOT KNOT D-4 YEARS O-SLOWLY C-BIGGER A-PRESSURE T-NONE  Review of Systems  All other systems reviewed and are negative.      Objective:   Physical Exam  Orientated 3  Vascular: DP pulses 2/4 bilaterally PT pulses 2/4 bilaterally Capillary reflex immediate bilaterally  Neurological: Sensation to 10 g monofilament wire intact 5/5 bilaterally Vibratory sensation intact bilaterally Ankle reflex equal and reactive bilaterally   Dermatological: Texture and turgor within normal limits The medial margin of the second left toenails incurvated and exquisitely tender to pressure which duplicates the primary pain sores The hallux nail margins are mildly incurvated There is a palpable thickening in the plantar fascial slip proximal to the first MPJ area measuring 3.5 m x 3.0 cm. The lesion is not adherent to the overlying skin. He states that this lesion has been present for 4 years as increased slightly in size and describes mild sensitivity in area  Musculoskeletal: Bilateral HAV deformities       Assessment & Plan:   Assessment: Satisfactory neurovascular status Ingrowing medial margin of the second left toenail Plantar fibromatosis left  Plan: Discussed with patient today that the plantar lesion most likely was consistent with plantar fibromatosis. At this time because patient has had lesion for 4 years with minimal symptoms will observe this area at six to 12 month intervals or at his request if it enlarges significantly or becomes more symptomatic. Also, made patient aware that topical medication sometimes could have benefited reducing size of the lesion. He said he was not interested in any active treatment rather what the lesion was  Offered patient permanent  removal of the medial margin second left toenail and he verbally consents. The second left toe was blocked with 2.5 mL of 50-50 mixture of 2% plain Xylocaine and 0.5% plain Marcaine. The toe was painted with Betadine and exsanguinated. The medial margin of the second left toenail was excised and a phenol matricectomy performed. An antibiotic compression dressing was applied. The tourniquet was released and pontine use capillary filling time noted in the second left toe. Patient tolerated procedure without any difficulty.  Postoperative oral and written instructions provided  Reappoint when necessary or at 6-12 month intervals for surveillance of the suspect plantar fibromatosis left

## 2014-12-17 NOTE — Patient Instructions (Addendum)
If pain control as needed okay to use over-the-counter Aleve, or ibuprofen, or acetaminophen  fcANTIBACTERIAL SOAP INSTRUCTIONS   THE DAY AFTER PROCEDURE  Please follow the instructions your doctor has marked.   Shower as usual. Before getting out, place a drop of antibacterial liquid soap (Dial) on a wet, clean washcloth.  Gently wipe washcloth over affected area.  Afterward, rinse the area with warm water.  Blot the area dry with a soft cloth and cover with antibiotic ointment (neosporin, polysporin, bacitracin) and band aid or gauze and tape Place 3-4 drops of antibacterial liquid soap in a quart of warm tap water.  Submerge foot into water for 20 minutes.  If bandage was applied after your procedure, leave on to allow for easy lift off, then remove and continue with soak for the remaining time.  Next, blot area dry with a soft cloth and cover with a bandage.  Apply other medications as directed by your doctor, such as cortisporin otic solution (eardrops) or neosporin antibiotic ointment

## 2015-01-09 ENCOUNTER — Ambulatory Visit (INDEPENDENT_AMBULATORY_CARE_PROVIDER_SITE_OTHER)
Admission: RE | Admit: 2015-01-09 | Discharge: 2015-01-09 | Disposition: A | Discharge status: Home / Self Care | Payer: BLUE CROSS/BLUE SHIELD | Source: Ambulatory Visit | Attending: Internal Medicine | Admitting: Internal Medicine

## 2015-01-09 DIAGNOSIS — Z87891 Personal history of nicotine dependence: Secondary | ICD-10-CM

## 2015-01-17 ENCOUNTER — Ambulatory Visit (HOSPITAL_COMMUNITY)
Admission: EM | Admit: 2015-01-17 | Discharge: 2015-01-18 | Disposition: A | Discharge status: Home / Self Care | Payer: BLUE CROSS/BLUE SHIELD | Attending: General Surgery | Admitting: General Surgery

## 2015-01-17 ENCOUNTER — Emergency Department (HOSPITAL_COMMUNITY): Payer: BLUE CROSS/BLUE SHIELD | Admitting: Anesthesiology

## 2015-01-17 ENCOUNTER — Encounter (HOSPITAL_COMMUNITY)
Admission: EM | Disposition: A | Discharge status: Home / Self Care | Payer: Self-pay | Source: Home / Self Care | Attending: Emergency Medicine

## 2015-01-17 ENCOUNTER — Encounter (HOSPITAL_COMMUNITY): Payer: Self-pay | Admitting: Emergency Medicine

## 2015-01-17 ENCOUNTER — Emergency Department (HOSPITAL_COMMUNITY): Payer: BLUE CROSS/BLUE SHIELD

## 2015-01-17 DIAGNOSIS — K3580 Unspecified acute appendicitis: Secondary | ICD-10-CM | POA: Insufficient documentation

## 2015-01-17 DIAGNOSIS — E669 Obesity, unspecified: Secondary | ICD-10-CM | POA: Insufficient documentation

## 2015-01-17 DIAGNOSIS — K219 Gastro-esophageal reflux disease without esophagitis: Secondary | ICD-10-CM | POA: Diagnosis not present

## 2015-01-17 DIAGNOSIS — K353 Acute appendicitis with localized peritonitis, without perforation or gangrene: Secondary | ICD-10-CM

## 2015-01-17 DIAGNOSIS — Z87891 Personal history of nicotine dependence: Secondary | ICD-10-CM | POA: Diagnosis not present

## 2015-01-17 DIAGNOSIS — Z6830 Body mass index (BMI) 30.0-30.9, adult: Secondary | ICD-10-CM | POA: Diagnosis not present

## 2015-01-17 HISTORY — PX: LAPAROSCOPIC APPENDECTOMY: SHX408

## 2015-01-17 LAB — COMPREHENSIVE METABOLIC PANEL
ALT: 50 U/L (ref 0–53)
AST: 26 U/L (ref 0–37)
Albumin: 4.4 g/dL (ref 3.5–5.2)
Alkaline Phosphatase: 81 U/L (ref 39–117)
Anion gap: 11 (ref 5–15)
BILIRUBIN TOTAL: 0.6 mg/dL (ref 0.3–1.2)
BUN: 13 mg/dL (ref 6–23)
CHLORIDE: 102 mmol/L (ref 96–112)
CO2: 27 mmol/L (ref 19–32)
Calcium: 9.2 mg/dL (ref 8.4–10.5)
Creatinine, Ser: 0.74 mg/dL (ref 0.50–1.35)
GLUCOSE: 120 mg/dL — AB (ref 70–99)
POTASSIUM: 4.1 mmol/L (ref 3.5–5.1)
Sodium: 140 mmol/L (ref 135–145)
Total Protein: 6.5 g/dL (ref 6.0–8.3)

## 2015-01-17 LAB — CBC WITH DIFFERENTIAL/PLATELET
BASOS ABS: 0 10*3/uL (ref 0.0–0.1)
Basophils Relative: 0 % (ref 0–1)
EOS PCT: 0 % (ref 0–5)
Eosinophils Absolute: 0 10*3/uL (ref 0.0–0.7)
HCT: 44 % (ref 39.0–52.0)
HEMOGLOBIN: 15.6 g/dL (ref 13.0–17.0)
LYMPHS ABS: 0.9 10*3/uL (ref 0.7–4.0)
LYMPHS PCT: 5 % — AB (ref 12–46)
MCH: 32.5 pg (ref 26.0–34.0)
MCHC: 35.5 g/dL (ref 30.0–36.0)
MCV: 91.7 fL (ref 78.0–100.0)
Monocytes Absolute: 1.2 10*3/uL — ABNORMAL HIGH (ref 0.1–1.0)
Monocytes Relative: 7 % (ref 3–12)
NEUTROS ABS: 15.9 10*3/uL — AB (ref 1.7–7.7)
NEUTROS PCT: 88 % — AB (ref 43–77)
PLATELETS: 199 10*3/uL (ref 150–400)
RBC: 4.8 MIL/uL (ref 4.22–5.81)
RDW: 12.2 % (ref 11.5–15.5)
WBC: 18.1 10*3/uL — ABNORMAL HIGH (ref 4.0–10.5)

## 2015-01-17 LAB — URINALYSIS, ROUTINE W REFLEX MICROSCOPIC
BILIRUBIN URINE: NEGATIVE
GLUCOSE, UA: NEGATIVE mg/dL
HGB URINE DIPSTICK: NEGATIVE
KETONES UR: NEGATIVE mg/dL
LEUKOCYTES UA: NEGATIVE
Nitrite: NEGATIVE
Protein, ur: NEGATIVE mg/dL
SPECIFIC GRAVITY, URINE: 1.02 (ref 1.005–1.030)
Urobilinogen, UA: 1 mg/dL (ref 0.0–1.0)
pH: 5 (ref 5.0–8.0)

## 2015-01-17 LAB — LIPASE, BLOOD: Lipase: 27 U/L (ref 11–59)

## 2015-01-17 SURGERY — APPENDECTOMY, LAPAROSCOPIC
Anesthesia: General | Site: Abdomen

## 2015-01-17 MED ORDER — OXYCODONE-ACETAMINOPHEN 5-325 MG PO TABS
1.0000 | ORAL_TABLET | ORAL | Status: DC | PRN
  Administered 2015-01-18 (×3): 2 via ORAL
  Filled 2015-01-17 (×3): qty 2

## 2015-01-17 MED ORDER — LIDOCAINE HCL (CARDIAC) 20 MG/ML IV SOLN
INTRAVENOUS | Status: DC | PRN
  Administered 2015-01-17: 100 mg via INTRAVENOUS

## 2015-01-17 MED ORDER — DEXAMETHASONE SODIUM PHOSPHATE 4 MG/ML IJ SOLN
INTRAMUSCULAR | Status: AC
  Filled 2015-01-17: qty 2

## 2015-01-17 MED ORDER — 0.9 % SODIUM CHLORIDE (POUR BTL) OPTIME
TOPICAL | Status: DC | PRN
  Administered 2015-01-17: 1000 mL

## 2015-01-17 MED ORDER — BUPIVACAINE-EPINEPHRINE (PF) 0.25% -1:200000 IJ SOLN
INTRAMUSCULAR | Status: AC
  Filled 2015-01-17: qty 30

## 2015-01-17 MED ORDER — FENTANYL CITRATE 0.05 MG/ML IJ SOLN
INTRAMUSCULAR | Status: AC
  Filled 2015-01-17: qty 5

## 2015-01-17 MED ORDER — ONDANSETRON HCL 4 MG/2ML IJ SOLN
4.0000 mg | Freq: Four times a day (QID) | INTRAMUSCULAR | Status: DC | PRN
  Administered 2015-01-17: 4 mg via INTRAVENOUS
  Filled 2015-01-17: qty 2

## 2015-01-17 MED ORDER — SODIUM CHLORIDE 0.9 % IR SOLN
Status: DC | PRN
  Administered 2015-01-17: 1000 mL

## 2015-01-17 MED ORDER — PROPOFOL 10 MG/ML IV BOLUS
INTRAVENOUS | Status: DC | PRN
  Administered 2015-01-17: 200 mg via INTRAVENOUS

## 2015-01-17 MED ORDER — OXYCODONE HCL 5 MG PO TABS: 5.0000 mg | ORAL_TABLET | Freq: Once | ORAL | Status: DC | PRN

## 2015-01-17 MED ORDER — ENOXAPARIN SODIUM 40 MG/0.4ML ~~LOC~~ SOLN: 40.0000 mg | SUBCUTANEOUS | Status: DC

## 2015-01-17 MED ORDER — FENTANYL CITRATE 0.05 MG/ML IJ SOLN
INTRAMUSCULAR | Status: DC | PRN
  Administered 2015-01-17 (×2): 50 ug via INTRAVENOUS
  Administered 2015-01-17: 150 ug via INTRAVENOUS

## 2015-01-17 MED ORDER — OXYCODONE HCL 5 MG/5ML PO SOLN: 5.0000 mg | Freq: Once | ORAL | Status: DC | PRN

## 2015-01-17 MED ORDER — BUPIVACAINE-EPINEPHRINE 0.25% -1:200000 IJ SOLN
INTRAMUSCULAR | Status: DC | PRN
  Administered 2015-01-17: 15 mL

## 2015-01-17 MED ORDER — HYDROMORPHONE HCL 1 MG/ML IJ SOLN
1.0000 mg | INTRAMUSCULAR | Status: DC | PRN
  Administered 2015-01-17: 1 mg via INTRAVENOUS
  Administered 2015-01-17: 2 mg via INTRAVENOUS
  Filled 2015-01-17: qty 2
  Filled 2015-01-17: qty 1

## 2015-01-17 MED ORDER — NEOSTIGMINE METHYLSULFATE 10 MG/10ML IV SOLN
INTRAVENOUS | Status: AC
  Filled 2015-01-17: qty 1

## 2015-01-17 MED ORDER — DEXAMETHASONE SODIUM PHOSPHATE 4 MG/ML IJ SOLN
INTRAMUSCULAR | Status: DC | PRN
  Administered 2015-01-17: 8 mg via INTRAVENOUS

## 2015-01-17 MED ORDER — LACTATED RINGERS IV SOLN
INTRAVENOUS | Status: DC | PRN
  Administered 2015-01-17: 15:00:00 via INTRAVENOUS

## 2015-01-17 MED ORDER — PROMETHAZINE HCL 25 MG/ML IJ SOLN: 6.2500 mg | INTRAMUSCULAR | Status: DC | PRN

## 2015-01-17 MED ORDER — SODIUM CHLORIDE 0.9 % IV SOLN
INTRAVENOUS | Status: DC
  Administered 2015-01-17: 18:00:00 via INTRAVENOUS

## 2015-01-17 MED ORDER — ONDANSETRON HCL 4 MG PO TABS: 4.0000 mg | ORAL_TABLET | Freq: Four times a day (QID) | ORAL | Status: DC | PRN

## 2015-01-17 MED ORDER — ONDANSETRON HCL 4 MG/2ML IJ SOLN
4.0000 mg | Freq: Once | INTRAMUSCULAR | Status: AC
  Administered 2015-01-17: 4 mg via INTRAVENOUS
  Filled 2015-01-17: qty 2

## 2015-01-17 MED ORDER — HYDROMORPHONE HCL 1 MG/ML IJ SOLN
1.0000 mg | Freq: Once | INTRAMUSCULAR | Status: AC
  Administered 2015-01-17: 1 mg via INTRAVENOUS
  Filled 2015-01-17: qty 1

## 2015-01-17 MED ORDER — GLYCOPYRROLATE 0.2 MG/ML IJ SOLN
INTRAMUSCULAR | Status: DC | PRN
  Administered 2015-01-17: 0.6 mg via INTRAVENOUS

## 2015-01-17 MED ORDER — GLYCOPYRROLATE 0.2 MG/ML IJ SOLN
INTRAMUSCULAR | Status: AC
  Filled 2015-01-17: qty 3

## 2015-01-17 MED ORDER — LACTATED RINGERS IV SOLN
INTRAVENOUS | Status: DC
  Administered 2015-01-17: 14:00:00 via INTRAVENOUS

## 2015-01-17 MED ORDER — ONDANSETRON HCL 4 MG/2ML IJ SOLN
INTRAMUSCULAR | Status: AC
  Filled 2015-01-17: qty 2

## 2015-01-17 MED ORDER — IOHEXOL 300 MG/ML  SOLN
100.0000 mL | Freq: Once | INTRAMUSCULAR | Status: AC | PRN
  Administered 2015-01-17: 100 mL via INTRAVENOUS

## 2015-01-17 MED ORDER — SODIUM CHLORIDE 0.9 % IV SOLN
INTRAVENOUS | Status: DC
  Administered 2015-01-17: 200 mL/h via INTRAVENOUS

## 2015-01-17 MED ORDER — SUCCINYLCHOLINE CHLORIDE 20 MG/ML IJ SOLN
INTRAMUSCULAR | Status: DC | PRN
  Administered 2015-01-17: 120 mg via INTRAVENOUS

## 2015-01-17 MED ORDER — MIDAZOLAM HCL 2 MG/2ML IJ SOLN
INTRAMUSCULAR | Status: AC
  Filled 2015-01-17: qty 2

## 2015-01-17 MED ORDER — HYDROMORPHONE HCL 1 MG/ML IJ SOLN: 0.2500 mg | INTRAMUSCULAR | Status: DC | PRN

## 2015-01-17 MED ORDER — DEXTROSE 5 % IV SOLN
2.0000 g | Freq: Once | INTRAVENOUS | Status: AC
  Administered 2015-01-17: 2 g via INTRAVENOUS
  Filled 2015-01-17: qty 2

## 2015-01-17 MED ORDER — NEOSTIGMINE METHYLSULFATE 10 MG/10ML IV SOLN
INTRAVENOUS | Status: DC | PRN
  Administered 2015-01-17: 4 mg via INTRAVENOUS

## 2015-01-17 MED ORDER — ONDANSETRON HCL 4 MG/2ML IJ SOLN
INTRAMUSCULAR | Status: DC | PRN
  Administered 2015-01-17: 4 mg via INTRAVENOUS

## 2015-01-17 MED ORDER — PANTOPRAZOLE SODIUM 40 MG PO TBEC
40.0000 mg | DELAYED_RELEASE_TABLET | Freq: Every day | ORAL | Status: DC
  Administered 2015-01-17: 40 mg via ORAL
  Filled 2015-01-17: qty 1

## 2015-01-17 MED ORDER — SODIUM CHLORIDE 0.9 % IV BOLUS (SEPSIS)
1000.0000 mL | Freq: Once | INTRAVENOUS | Status: AC
  Administered 2015-01-17: 1000 mL via INTRAVENOUS

## 2015-01-17 MED ORDER — PROPOFOL 10 MG/ML IV BOLUS
INTRAVENOUS | Status: AC
  Filled 2015-01-17: qty 20

## 2015-01-17 MED ORDER — IOHEXOL 300 MG/ML  SOLN
25.0000 mL | Freq: Once | INTRAMUSCULAR | Status: AC | PRN
  Administered 2015-01-17: 25 mL via ORAL

## 2015-01-17 MED ORDER — MIDAZOLAM HCL 5 MG/5ML IJ SOLN
INTRAMUSCULAR | Status: DC | PRN
  Administered 2015-01-17: 2 mg via INTRAVENOUS

## 2015-01-17 MED ORDER — ACETAMINOPHEN 325 MG PO TABS: 650.0000 mg | ORAL_TABLET | ORAL | Status: DC | PRN

## 2015-01-17 MED ORDER — ROCURONIUM BROMIDE 100 MG/10ML IV SOLN
INTRAVENOUS | Status: DC | PRN
  Administered 2015-01-17: 10 mg via INTRAVENOUS
  Administered 2015-01-17: 30 mg via INTRAVENOUS

## 2015-01-17 SURGICAL SUPPLY — 42 items
APPLIER CLIP ROT 10 11.4 M/L (STAPLE)
BLADE SURG ROTATE 9660 (MISCELLANEOUS) IMPLANT
CANISTER SUCTION 2500CC (MISCELLANEOUS) ×3 IMPLANT
CHLORAPREP W/TINT 26ML (MISCELLANEOUS) ×3 IMPLANT
CLIP APPLIE ROT 10 11.4 M/L (STAPLE) IMPLANT
CLOSURE WOUND 1/2 X4 (GAUZE/BANDAGES/DRESSINGS) ×1
COVER SURGICAL LIGHT HANDLE (MISCELLANEOUS) ×3 IMPLANT
CUTTER LINEAR ENDO 35 ETS (STAPLE) ×3 IMPLANT
CUTTER LINEAR ENDO 35 ETS TH (STAPLE) IMPLANT
DRAPE LAPAROSCOPIC ABDOMINAL (DRAPES) ×3 IMPLANT
DRSG TEGADERM 2-3/8X2-3/4 SM (GAUZE/BANDAGES/DRESSINGS) ×9 IMPLANT
ELECT REM PT RETURN 9FT ADLT (ELECTROSURGICAL) ×3
ELECTRODE REM PT RTRN 9FT ADLT (ELECTROSURGICAL) ×1 IMPLANT
ENDOLOOP SUT PDS II  0 18 (SUTURE)
ENDOLOOP SUT PDS II 0 18 (SUTURE) IMPLANT
GLOVE BIOGEL PI IND STRL 8 (GLOVE) ×1 IMPLANT
GLOVE BIOGEL PI INDICATOR 8 (GLOVE) ×2
GLOVE ECLIPSE 7.5 STRL STRAW (GLOVE) ×3 IMPLANT
GOWN STRL REUS W/ TWL LRG LVL3 (GOWN DISPOSABLE) ×2 IMPLANT
GOWN STRL REUS W/TWL LRG LVL3 (GOWN DISPOSABLE) ×4
KIT BASIN OR (CUSTOM PROCEDURE TRAY) ×3 IMPLANT
KIT ROOM TURNOVER OR (KITS) ×3 IMPLANT
LIQUID BAND (GAUZE/BANDAGES/DRESSINGS) ×3 IMPLANT
NS IRRIG 1000ML POUR BTL (IV SOLUTION) ×3 IMPLANT
PAD ARMBOARD 7.5X6 YLW CONV (MISCELLANEOUS) ×6 IMPLANT
PENCIL BUTTON HOLSTER BLD 10FT (ELECTRODE) IMPLANT
POUCH SPECIMEN RETRIEVAL 10MM (ENDOMECHANICALS) ×3 IMPLANT
RELOAD /EVU35 (ENDOMECHANICALS) IMPLANT
RELOAD CUTTER ETS 35MM STAND (ENDOMECHANICALS) IMPLANT
SCALPEL HARMONIC ACE (MISCELLANEOUS) ×3 IMPLANT
SET IRRIG TUBING LAPAROSCOPIC (IRRIGATION / IRRIGATOR) ×3 IMPLANT
SLEEVE ENDOPATH XCEL 5M (ENDOMECHANICALS) ×3 IMPLANT
SPECIMEN JAR SMALL (MISCELLANEOUS) ×3 IMPLANT
STRIP CLOSURE SKIN 1/2X4 (GAUZE/BANDAGES/DRESSINGS) ×2 IMPLANT
SUT MNCRL AB 4-0 PS2 18 (SUTURE) ×3 IMPLANT
TOWEL OR 17X24 6PK STRL BLUE (TOWEL DISPOSABLE) ×3 IMPLANT
TOWEL OR 17X26 10 PK STRL BLUE (TOWEL DISPOSABLE) ×3 IMPLANT
TRAY FOLEY CATH 16FR SILVER (SET/KITS/TRAYS/PACK) ×3 IMPLANT
TRAY LAPAROSCOPIC (CUSTOM PROCEDURE TRAY) ×3 IMPLANT
TROCAR XCEL BLUNT TIP 100MML (ENDOMECHANICALS) ×3 IMPLANT
TROCAR XCEL NON-BLD 5MMX100MML (ENDOMECHANICALS) ×3 IMPLANT
TUBING INSUFFLATION (TUBING) ×3 IMPLANT

## 2015-01-17 NOTE — Transfer of Care (Signed)
Immediate Anesthesia Transfer of Care Note  Patient: Terrance Ortiz  Procedure(s) Performed: Procedure(s): APPENDECTOMY LAPAROSCOPIC (N/A)  Patient Location: PACU  Anesthesia Type:General  Level of Consciousness: awake, alert , oriented and patient cooperative  Airway & Oxygen Therapy: Patient Spontanous Breathing  Post-op Assessment: Report given to RN, Post -op Vital signs reviewed and stable and Patient moving all extremities  Post vital signs: Reviewed and stable  Last Vitals:  Filed Vitals:   01/17/15 1315  BP: 136/93  Pulse: 100  Temp:   Resp: 21    Complications: No apparent anesthesia complications

## 2015-01-17 NOTE — ED Notes (Signed)
Pt c/o right lower quadrant abdominal pain that radiates into groin with ambulation onset at 0015 today. Pt denies N/V, back pain, heavy lifting or urinary symptoms.

## 2015-01-17 NOTE — H&P (Signed)
Terrance Ortiz is an 59 y.o. male.   Chief Complaint: Abdominal pain and acute appendicitis HPI: Patient wstarted having severe abdominal pain about midnight associated with nausea, but not vomiting.  Pain progressed to more severe pain in the RLQ.  He looked up his symptoms on WebMD and self diagnosed hiimself with acute appendicitis, came to the ED for evaluation.  Clinical examination and CT confirmed the diagnosis of acute appendicitis.  Past Medical History  Diagnosis Date  . Varicella   . Knee pain     recurrent intermittent tendonitis  . Obesity   . Sleep disorder   . GERD (gastroesophageal reflux disease)   . Barrett's esophagus   . Tubular adenoma of colon 09/2011    Past Surgical History  Procedure Laterality Date  . Fracture collar bone      no surgery  . Vasectomy  1995  . Colonoscopy    . Upper gastrointestinal endoscopy    . Tonsillectomy      1975    Family History  Problem Relation Age of Onset  . Cancer Father     esophageal cancer-had mets but survived 57 yrs  . Esophageal cancer Father   . Diabetes Neg Hx   . Hypertension Neg Hx   . Hyperlipidemia Neg Hx   . Heart disease Neg Hx   . Colon cancer Neg Hx   . Rectal cancer Neg Hx   . Stomach cancer Neg Hx    Social History:  reports that he quit smoking about 4 years ago. He has never used smokeless tobacco. He reports that he drinks alcohol. He reports that he does not use illicit drugs.  Allergies: No Known Allergies   (Not in a hospital admission)  Results for orders placed or performed during the hospital encounter of 01/17/15 (from the past 48 hour(s))  CBC with Differential     Status: Abnormal   Collection Time: 01/17/15  8:23 AM  Result Value Ref Range   WBC 18.1 (H) 4.0 - 10.5 K/uL   RBC 4.80 4.22 - 5.81 MIL/uL   Hemoglobin 15.6 13.0 - 17.0 g/dL   HCT 44.0 39.0 - 52.0 %   MCV 91.7 78.0 - 100.0 fL   MCH 32.5 26.0 - 34.0 pg   MCHC 35.5 30.0 - 36.0 g/dL   RDW 12.2 11.5 - 15.5 %   Platelets 199 150 - 400 K/uL   Neutrophils Relative % 88 (H) 43 - 77 %   Neutro Abs 15.9 (H) 1.7 - 7.7 K/uL   Lymphocytes Relative 5 (L) 12 - 46 %   Lymphs Abs 0.9 0.7 - 4.0 K/uL   Monocytes Relative 7 3 - 12 %   Monocytes Absolute 1.2 (H) 0.1 - 1.0 K/uL   Eosinophils Relative 0 0 - 5 %   Eosinophils Absolute 0.0 0.0 - 0.7 K/uL   Basophils Relative 0 0 - 1 %   Basophils Absolute 0.0 0.0 - 0.1 K/uL  Comprehensive metabolic panel     Status: Abnormal   Collection Time: 01/17/15  8:23 AM  Result Value Ref Range   Sodium 140 135 - 145 mmol/L   Potassium 4.1 3.5 - 5.1 mmol/L   Chloride 102 96 - 112 mmol/L   CO2 27 19 - 32 mmol/L   Glucose, Bld 120 (H) 70 - 99 mg/dL   BUN 13 6 - 23 mg/dL   Creatinine, Ser 0.74 0.50 - 1.35 mg/dL   Calcium 9.2 8.4 - 10.5 mg/dL   Total Protein 6.5  6.0 - 8.3 g/dL   Albumin 4.4 3.5 - 5.2 g/dL   AST 26 0 - 37 U/L   ALT 50 0 - 53 U/L   Alkaline Phosphatase 81 39 - 117 U/L   Total Bilirubin 0.6 0.3 - 1.2 mg/dL   GFR calc non Af Amer >90 >90 mL/min   GFR calc Af Amer >90 >90 mL/min    Comment: (NOTE) The eGFR has been calculated using the CKD EPI equation. This calculation has not been validated in all clinical situations. eGFR's persistently <90 mL/min signify possible Chronic Kidney Disease.    Anion gap 11 5 - 15  Lipase, blood     Status: None   Collection Time: 01/17/15  8:23 AM  Result Value Ref Range   Lipase 27 11 - 59 U/L  Urinalysis, Routine w reflex microscopic     Status: None   Collection Time: 01/17/15 10:16 AM  Result Value Ref Range   Color, Urine YELLOW YELLOW   APPearance CLEAR CLEAR   Specific Gravity, Urine 1.020 1.005 - 1.030   pH 5.0 5.0 - 8.0   Glucose, UA NEGATIVE NEGATIVE mg/dL   Hgb urine dipstick NEGATIVE NEGATIVE   Bilirubin Urine NEGATIVE NEGATIVE   Ketones, ur NEGATIVE NEGATIVE mg/dL   Protein, ur NEGATIVE NEGATIVE mg/dL   Urobilinogen, UA 1.0 0.0 - 1.0 mg/dL   Nitrite NEGATIVE NEGATIVE   Leukocytes, UA NEGATIVE  NEGATIVE    Comment: MICROSCOPIC NOT DONE ON URINES WITH NEGATIVE PROTEIN, BLOOD, LEUKOCYTES, NITRITE, OR GLUCOSE <1000 mg/dL.   Ct Abdomen Pelvis W Contrast  01/17/2015   CLINICAL DATA:  Acute right lower quadrant abdominal pain.  EXAM: CT ABDOMEN AND PELVIS WITH CONTRAST  TECHNIQUE: Multidetector CT imaging of the abdomen and pelvis was performed using the standard protocol following bolus administration of intravenous contrast.  CONTRAST:  114m OMNIPAQUE IOHEXOL 300 MG/ML  SOLN  COMPARISON:  None.  FINDINGS: Visualized lung bases appear normal. No significant osseous abnormality is noted.  No gallstones are noted. The liver, spleen and pancreas appear normal. Adrenal glands appear normal. No hydronephrosis or renal obstruction is noted. No renal or ureteral calculi are noted. The appendix is enlarged with surrounding inflammation consistent with appendicitis. Probable appendicolith is noted as well. There is no evidence of bowel obstruction. Urinary bladder appears normal. No abnormal fluid collection is noted. Abdominal aorta appears normal. No significant adenopathy is noted.  IMPRESSION: Findings consistent with acute appendicitis. No definite abscess is seen at this time.   Electronically Signed   By: JMarijo Conception M.D.   On: 01/17/2015 10:55    Review of Systems  Constitutional: Negative for fever and chills.  HENT: Negative.   Eyes: Negative.   Respiratory: Negative.   Cardiovascular: Negative.   Gastrointestinal: Positive for abdominal pain. Negative for vomiting.  Genitourinary: Negative.   Musculoskeletal: Negative.   Skin: Negative.   Neurological: Negative.   Endo/Heme/Allergies: Negative.   Psychiatric/Behavioral: Negative.     Blood pressure 139/78, pulse 90, temperature 97.7 F (36.5 C), temperature source Oral, resp. rate 20, height _0  (1.803 m), weight 97.523 kg (215 lb), SpO2 97 %. Physical Exam  Constitutional: He is oriented to person, place, and time. He  appears well-developed and well-nourished.  Cardiovascular: Normal rate, regular rhythm and normal heart sounds.   Respiratory: Effort normal and breath sounds normal.  GI: Soft. Normal appearance and bowel sounds are normal. There is tenderness in the right lower quadrant. There is tenderness at McBurney's point. There  is no rigidity, no rebound, no guarding and no CVA tenderness.    Musculoskeletal: Normal range of motion.  Neurological: He is alert and oriented to person, place, and time. He has normal reflexes.  Skin: Skin is warm and dry.  Psychiatric: He has a normal mood and affect. His behavior is normal. Judgment and thought content normal.     Assessment/Plan Acute appendicitis  OR for acute appendicitis. Has received antibiotics.  He understands the risks and benefits, and he wishes to proceed.  Clarivel Callaway, JAY 01/17/2015, 1:10 PM

## 2015-01-17 NOTE — ED Provider Notes (Signed)
CSN: [id number]     Arrival date & time 01/17/15  0932 History   First MD Initiated Contact with Patient 01/17/15 0757     Chief Complaint  Patient presents with  . Abdominal Pain     (Consider location/radiation/quality/duration/timing/severity/associated sxs/prior Treatment) HPI The patient was well before going to bed. At approximately midnight he awoke with bad abdominal pain. He reports at onset it felt like it was his whole central abdomen that was painful. He denies it was exactly cramping in nature but more of a deep aching quality. It continued through the night and localized to his right lower quadrant. He reports that the pain is severe. He has had nausea but no vomiting or diarrhea. He was able to get mildly comfortable for couple of hours on the couch in the morning but the pain continued to worsen. He denies he's ever had similar type of pain. The patient ate at a restaurant yesterday evening. Other family members had eaten there as well and have not become ill. The patient reports he had Caesar salad, fried oysters and some scallops. No prior abdominal surgeries. The patient denies pain is radiating into his back, testicles or legs. Past Medical History  Diagnosis Date  . Varicella   . Knee pain     recurrent intermittent tendonitis  . Obesity   . Sleep disorder   . GERD (gastroesophageal reflux disease)   . Barrett's esophagus   . Tubular adenoma of colon 09/2011   Past Surgical History  Procedure Laterality Date  . Fracture collar bone      no surgery  . Vasectomy  1995  . Colonoscopy    . Upper gastrointestinal endoscopy    . Tonsillectomy      1975   Family History  Problem Relation Age of Onset  . Cancer Father     esophageal cancer-had mets but survived 11 yrs  . Esophageal cancer Father   . Diabetes Neg Hx   . Hypertension Neg Hx   . Hyperlipidemia Neg Hx   . Heart disease Neg Hx   . Colon cancer Neg Hx   . Rectal cancer Neg Hx   . Stomach cancer Neg  Hx    History  Substance Use Topics  . Smoking status: Former Smoker -- 0.30 packs/day for 20 years    Quit date: 01/11/2011  . Smokeless tobacco: Never Used  . Alcohol Use: 0.0 oz/week     Comment: wine weekly    Review of Systems  10 Systems reviewed and are negative for acute change except as noted in the HPI.   Allergies  Review of patient's allergies indicates no known allergies.  Home Medications   Prior to Admission medications   Medication Sig Start Date End Date Taking? Authorizing Provider  Naproxen Sodium (ALEVE PO) Take 2 tablets by mouth as needed (for pain).    Yes Historical Provider, MD  omeprazole (PRILOSEC) 20 MG capsule Take 1 capsule (20 mg total) by mouth daily. 11/17/14 11/23/15 Yes Olga Millers, MD   BP 125/73 mmHg  Pulse 94  Temp(Src) 97.7 F (36.5 C) (Oral)  Resp 17  Ht 5\' 11"  (1.803 m)  Wt 215 lb (97.523 kg)  BMI 30.00 kg/m2  SpO2 98% Physical Exam  Constitutional: He is oriented to person, place, and time. He appears well-developed and well-nourished.  The patient is in pain by appearance. He is alert and nontoxic. He has no respiratory distress. His color is good.  HENT:  Head: Normocephalic  and atraumatic.  Eyes: EOM are normal. Right eye exhibits no discharge. Left eye exhibits no discharge.  Neck: Neck supple.  Cardiovascular: Normal rate, regular rhythm, normal heart sounds and intact distal pulses.   Pulmonary/Chest: Effort normal and breath sounds normal.  Abdominal: Soft. Bowel sounds are normal. He exhibits no distension. There is tenderness.  Patient has severe right upper and mid quadrant tenderness, as well as tenderness to the lower right quadrant. There does appear to be guarding in this region. No tenderness in the left upper or lateral quadrants. No rebound tenderness. No significant suprapubic tenderness.  Musculoskeletal: Normal range of motion. He exhibits no edema.  Neurological: He is alert and oriented to person,  place, and time. He has normal strength. Coordination normal. GCS eye subscore is 4. GCS verbal subscore is 5. GCS motor subscore is 6.  Skin: Skin is warm, dry and intact.  Psychiatric: He has a normal mood and affect.    ED Course  Procedures (including critical care time) Labs Review Labs Reviewed  CBC WITH DIFFERENTIAL/PLATELET - Abnormal; Notable for the following:    WBC 18.1 (*)    Neutrophils Relative % 88 (*)    Neutro Abs 15.9 (*)    Lymphocytes Relative 5 (*)    Monocytes Absolute 1.2 (*)    All other components within normal limits  COMPREHENSIVE METABOLIC PANEL - Abnormal; Notable for the following:    Glucose, Bld 120 (*)    All other components within normal limits  URINALYSIS, ROUTINE W REFLEX MICROSCOPIC  LIPASE, BLOOD    Imaging Review Ct Abdomen Pelvis W Contrast  01/17/2015   CLINICAL DATA:  Acute right lower quadrant abdominal pain.  EXAM: CT ABDOMEN AND PELVIS WITH CONTRAST  TECHNIQUE: Multidetector CT imaging of the abdomen and pelvis was performed using the standard protocol following bolus administration of intravenous contrast.  CONTRAST:  127mL OMNIPAQUE IOHEXOL 300 MG/ML  SOLN  COMPARISON:  None.  FINDINGS: Visualized lung bases appear normal. No significant osseous abnormality is noted.  No gallstones are noted. The liver, spleen and pancreas appear normal. Adrenal glands appear normal. No hydronephrosis or renal obstruction is noted. No renal or ureteral calculi are noted. The appendix is enlarged with surrounding inflammation consistent with appendicitis. Probable appendicolith is noted as well. There is no evidence of bowel obstruction. Urinary bladder appears normal. No abnormal fluid collection is noted. Abdominal aorta appears normal. No significant adenopathy is noted.  IMPRESSION: Findings consistent with acute appendicitis. No definite abscess is seen at this time.   Electronically Signed   By: Marijo Conception, M.D.   On: 01/17/2015 10:55     EKG  Interpretation None      MDM   Final diagnoses:  Acute appendicitis with localized peritonitis       Charlesetta Shanks, MD 01/17/15 [phone]

## 2015-01-17 NOTE — Anesthesia Preprocedure Evaluation (Addendum)
Anesthesia Evaluation  Patient identified by MRN, date of birth, ID band Patient awake    Reviewed: NPO status , Patient's Chart, lab work & pertinent test results  History of Anesthesia Complications Negative for: history of anesthetic complications  Airway Mallampati: II   Neck ROM: Full    Dental  (+) Teeth Intact,    Pulmonary former smoker,  breath sounds clear to auscultation        Cardiovascular negative cardio ROS  Rhythm:Regular     Neuro/Psych    GI/Hepatic Neg liver ROS, GERD-  ,  Endo/Other    Renal/GU negative Renal ROS     Musculoskeletal   Abdominal   Peds  Hematology negative hematology ROS (+)   Anesthesia Other Findings   Reproductive/Obstetrics                            Anesthesia Physical Anesthesia Plan  ASA: II  Anesthesia Plan: General   Post-op Pain Management:    Induction: Intravenous  Airway Management Planned: Oral ETT  Additional Equipment:   Intra-op Plan:   Post-operative Plan: Extubation in OR  Informed Consent: I have reviewed the patients History and Physical, chart, labs and discussed the procedure including the risks, benefits and alternatives for the proposed anesthesia with the patient or authorized representative who has indicated his/her understanding and acceptance.   Dental advisory given  Plan Discussed with:   Anesthesia Plan Comments:         Anesthesia Quick Evaluation

## 2015-01-17 NOTE — Op Note (Signed)
OPERATIVE REPORT  DATE OF OPERATION: 01/17/2015  PATIENT:  Terrance Ortiz  59 y.o. male  PRE-OPERATIVE DIAGNOSIS:  Acute appendicitis  POST-OPERATIVE DIAGNOSIS:  acute appendicitis  PROCEDURE:  Procedure(s): APPENDECTOMY LAPAROSCOPIC  SURGEON:  Surgeon(s): Doreen Salvage, MD  ASSISTANT: None  ANESTHESIA:   general  EBL: <20 ml  BLOOD ADMINISTERED: none  DRAINS: none   SPECIMEN:  Source of Specimen:  aappendix  COUNTS CORRECT:  YES  PROCEDURE DETAILS: The patient was taken to the operating room and placed on the table in the supine position. After an adequate general endotracheal anesthetic was administered she was prepped and draped in the usual sterile manner exposing the abdomen.  A proper timeout was performed identifying the patient and the procedure to be performed. A supraumbilical midline incision was made using a #15 blade. It was taken down to the midline fascia which was incised with a #15 blade. We bluntly dissected down into the peritoneal cavity. A pursestring suture of 0 Vicryl was passed around the fascial opening. A Hassan cannula was passed in the fascia and secured in place with the pursestring suture.  Carbon dioxide was insufflated into the peritoneal cavity up to a maximal intra-abdominal pressure 15 mmHg. A right upper quadrant 5 mm cannula and a left low quadrant 5 mm cannula pass under direct vision. The patient was placed in Trendelenburg and the left side was tilted down.  The acutely inflamed appendix was anterior and lateral. There were minimal to no peritoneal attachments. We dissected out the appendix down to the base of the cecum taking the mesoappendix using a Harmonic Scalpel. Once the base was clear of vascular structures we came across the base using an Endo GIA blue cartridge stapler. We subsequently retrieved the appendix from the 11 mm cannula using an Endo Catch bag.  We inspected the base for complete closure and was done. There was minimal  to no bleeding. We irrigated with saline solution up to about 500 mL and then we aspirated all fluid and gas from above the liver and closed.  The supraumbilical fascia was closed using the pursestring suture which was in place. 0/25%percent Marcaine with epinephrine was injected at all sites. The skin was closed at the super umbilical site using a running subcuticular stitch of 4-0 Monocryl. The other incisions were closed with Dermabond and the super umbilical incision was reinforced with Dermabond. Steri-Strips and Tegaderms are used to complete the dressings. All needle counts, sponge counts, then instrument counts were correct.  PATIENT DISPOSITION:  PACU - hemodynamically stable.   Doreen Salvage 2/27/20163:32 PM

## 2015-01-17 NOTE — ED Notes (Signed)
MD Wyatt at the bedside.

## 2015-01-17 NOTE — Anesthesia Postprocedure Evaluation (Signed)
  Anesthesia Post-op Note  Patient: Terrance Ortiz  Procedure(s) Performed: Procedure(s): APPENDECTOMY LAPAROSCOPIC (N/A)  Patient Location: PACU  Anesthesia Type:General  Level of Consciousness: awake and alert   Airway and Oxygen Therapy: Patient Spontanous Breathing  Post-op Pain: mild  Post-op Assessment: Post-op Vital signs reviewed  Post-op Vital Signs: stable  Last Vitals:  Filed Vitals:   01/17/15 1644  BP: 158/90  Pulse: 104  Temp: 36.9 C  Resp: 16    Complications: No apparent anesthesia complications

## 2015-01-18 MED ORDER — OXYCODONE-ACETAMINOPHEN 5-325 MG PO TABS: 1.0000 | ORAL_TABLET | ORAL | Status: DC | PRN

## 2015-01-18 NOTE — Progress Notes (Signed)
Pt ready for DC to home accomp by wife.  DC instructions given and explained to pt and wife.  Rx for percocet given and explained.  FU 2 weeks Dr. Hulen Skains, # given.  Pt understands wd care, activity and diet.  No further questions about home self care.

## 2015-01-18 NOTE — Discharge Instructions (Signed)
Laparoscopic Appendectomy Care After Refer to this sheet in the next few weeks. These instructions provide you with information on caring for yourself after your procedure. Your caregiver may also give you more specific instructions. Your treatment has been planned according to current medical practices, but problems sometimes occur. Call your caregiver if you have any problems or questions after your procedure. HOME CARE INSTRUCTIONS  Do not drive while taking narcotic pain medicines.  Use stool softener if you become constipated from your pain medicines.  Change your bandages (dressings) as directed.  Keep your wounds clean and dry. You may wash the wounds gently with soap and water. Gently pat the wounds dry with a clean towel.  Do not take baths, swim, or use hot tubs for 10 days, or as instructed by your caregiver.  Only take over-the-counter or prescription medicines for pain, discomfort, or fever as directed by your caregiver.  You may continue your normal diet as directed.  Do not lift more than 10 pounds (4.5 kg) or play contact sports for 3 weeks, or as directed.  Slowly increase your activity after surgery.  Take deep breaths to avoid getting a lung infection (pneumonia). SEEK MEDICAL CARE IF:  You have redness, swelling, or increasing pain in your wounds.  You have pus coming from your wounds.  You have drainage from a wound that lasts longer than 1 day.  You notice a bad smell coming from the wounds or dressing.  Your wound edges break open after stitches (sutures) have been removed.  You notice increasing pain in the shoulders (shoulder strap areas) or near your shoulder blades.  You develop dizzy episodes or fainting while standing.  You develop shortness of breath.  You develop persistent nausea or vomiting.  You cannot control your bowel functions or lose your appetite.  You develop diarrhea. SEEK IMMEDIATE MEDICAL CARE IF:   You have a fever.  You  develop a rash.  You have difficulty breathing or sharp pains in your chest.  You develop any reaction or side effects to medicines given. MAKE SURE YOU:  Understand these instructions.  Will watch your condition.  Will get help right away if you are not doing well or get worse. Document Released: 11/07/2005 Document Revised: 01/30/2012 Document Reviewed: 05/17/2011 [facility] Patient Information 2015 [place], [place]. This information is not intended to replace advice given to you by your health care provider. Make sure you discuss any questions you have with your health care provider.

## 2015-01-18 NOTE — Discharge Summary (Signed)
Physician Discharge Summary  Patient ID: Terrance Ortiz MRN: [id number] DOB/AGE: [birth year] 59 y.o.  Admit date: 01/17/2015 Discharge date: 01/18/2015  Admission Diagnoses: acute appendicitis  Discharge Diagnoses:  Active Problems:   Acute appendicitis s/p Lap appy  Discharged Condition: good  Hospital Course: "PT was admitted with acute appendicitis.  He underwent lap appy urgently.  Post op he was sent to the floor and started on a diet.  He tolerated that well.  He had min abdn POD #1.  He was ambulating well on his own and deemed stable for DC and Dc'd home.   Consults: None  Significant Diagnostic Studies: none  Treatments: surgery: as above  Discharge Exam: Blood pressure 123/59, pulse 66, temperature 97.6 F (36.4 C), temperature source Oral, resp. rate 17, height 5\' 11"  (1.803 m), weight 218 lb 0.6 oz (98.9 kg), SpO2 98 %. General appearance: alert and cooperative GI: soft, approp ttp, wound c/d/i  Disposition: Final discharge disposition not confirmed  Discharge Instructions    Diet - low sodium heart healthy    Complete by:  As directed      Increase activity slowly    Complete by:  As directed             Medication List    TAKE these medications        ALEVE PO  Take 2 tablets by mouth as needed (for pain).     omeprazole 20 MG capsule  Commonly known as:  PRILOSEC  Take 1 capsule (20 mg total) by mouth daily.     oxyCODONE-acetaminophen 5-325 MG per tablet  Commonly known as:  PERCOCET/ROXICET  Take 1-2 tablets by mouth every 4 (four) hours as needed for moderate pain.         Signed: Rosario Jacks., Quina Wilbourne 01/18/2015, 7:28 AM

## 2015-01-19 ENCOUNTER — Encounter (HOSPITAL_COMMUNITY): Payer: Self-pay | Admitting: General Surgery

## 2015-03-27 ENCOUNTER — Emergency Department (INDEPENDENT_AMBULATORY_CARE_PROVIDER_SITE_OTHER)
Admission: EM | Admit: 2015-03-27 | Discharge: 2015-03-27 | Disposition: A | Discharge status: Home / Self Care | Payer: BLUE CROSS/BLUE SHIELD | Source: Home / Self Care | Attending: Family Medicine | Admitting: Family Medicine

## 2015-03-27 DIAGNOSIS — N451 Epididymitis: Secondary | ICD-10-CM

## 2015-03-27 LAB — POCT URINALYSIS DIP (DEVICE)
BILIRUBIN URINE: NEGATIVE
GLUCOSE, UA: NEGATIVE mg/dL
Hgb urine dipstick: NEGATIVE
KETONES UR: NEGATIVE mg/dL
LEUKOCYTES UA: NEGATIVE
Nitrite: NEGATIVE
PH: 5.5 (ref 5.0–8.0)
Protein, ur: NEGATIVE mg/dL
Specific Gravity, Urine: 1.02 (ref 1.005–1.030)
Urobilinogen, UA: 0.2 mg/dL (ref 0.0–1.0)

## 2015-03-27 MED ORDER — DOXYCYCLINE HYCLATE 50 MG PO CAPS: 50.0000 mg | ORAL_CAPSULE | Freq: Two times a day (BID) | ORAL | Status: DC

## 2015-03-27 NOTE — ED Provider Notes (Signed)
CSN: [id number]     Arrival date & time 03/27/15  0801 History   First MD Initiated Contact with Patient 03/27/15 0815     No chief complaint on file.  (Consider location/radiation/quality/duration/timing/severity/associated sxs/prior Treatment) HPI Comments: 59 year old man states he was walking a golf course in [place] yesterday 20 of the day he noticed some pain in the left testicle. When he went home and sat down and rested the pain resolved. He was able to sleep last night after taking some Aleve. He had mild pain at that time. He states while sitting there is no pain. But when he begans to ambulate this produces pain in the left testicle. Denies trauma.    Past Medical History  Diagnosis Date  . Varicella   . Knee pain     recurrent intermittent tendonitis  . Obesity   . Sleep disorder   . GERD (gastroesophageal reflux disease)   . Barrett's esophagus   . Tubular adenoma of colon 09/2011   Past Surgical History  Procedure Laterality Date  . Fracture collar bone      no surgery  . Vasectomy  1995  . Colonoscopy    . Upper gastrointestinal endoscopy    . Tonsillectomy      1975  . Laparoscopic appendectomy N/A 01/17/2015    Procedure: APPENDECTOMY LAPAROSCOPIC;  Surgeon: Doreen Salvage, MD;  Location: [facility] OR;  Service: General;  Laterality: N/A;   Family History  Problem Relation Age of Onset  . Cancer Father     esophageal cancer-had mets but survived 23 yrs  . Esophageal cancer Father   . Diabetes Neg Hx   . Hypertension Neg Hx   . Hyperlipidemia Neg Hx   . Heart disease Neg Hx   . Colon cancer Neg Hx   . Rectal cancer Neg Hx   . Stomach cancer Neg Hx    History  Substance Use Topics  . Smoking status: Former Smoker -- 0.30 packs/day for 20 years    Quit date: 01/11/2011  . Smokeless tobacco: Never Used  . Alcohol Use: 0.0 oz/week     Comment: wine weekly    Review of Systems  Constitutional: Negative.   Genitourinary: Positive for testicular pain. Negative for  dysuria, urgency, frequency, hematuria, flank pain, decreased urine volume, discharge, penile swelling, scrotal swelling, genital sores and penile pain.  Musculoskeletal: Negative.   Neurological: Negative.   All other systems reviewed and are negative.   Allergies  Review of patient's allergies indicates no known allergies.  Home Medications   Prior to Admission medications   Medication Sig Start Date End Date Taking? Authorizing Provider  Naproxen Sodium (ALEVE PO) Take 2 tablets by mouth as needed (for pain).     Historical Provider, MD  omeprazole (PRILOSEC) 20 MG capsule Take 1 capsule (20 mg total) by mouth daily. 11/17/14 11/23/15  Olga Millers, MD  oxyCODONE-acetaminophen (PERCOCET/ROXICET) 5-325 MG per tablet Take 1-2 tablets by mouth every 4 (four) hours as needed for moderate pain. 01/18/15   Ralene Ok, MD   BP 135/87 mmHg  Pulse 71  Temp(Src) 98.5 F (36.9 C) (Oral)  Resp 14  SpO2 96% Physical Exam  Constitutional: He is oriented to person, place, and time. He appears well-developed and well-nourished. No distress.  Neck: Normal range of motion. Neck supple.  Pulmonary/Chest: Effort normal. No respiratory distress.  Abdominal: Soft. There is no tenderness.  Genitourinary: No penile tenderness.  Examination will patient supine. No tenderness to the inguinal/groin musculature. Patient is  able to perform straight leg raises without producing pain. There is no pain or tenderness to the suprapubic areas. Examining the patient while standing reveals that both testicles are retracted. I am able to palpate both testicles. He left testicle is tender particularly toward the superior pole. Mild tenderness to the testicle itself. There is no pain. Only tenderness with palpation. No discoloration or swelling to the scrotum or testicle. Orientation of the testicle is difficult to discern for both as they are both retracted. No surrounding lymphadenopathy.  Musculoskeletal: Normal  range of motion. He exhibits no edema or tenderness.  Neurological: He is alert and oriented to person, place, and time. No cranial nerve deficit. He exhibits normal muscle tone.  Skin: Skin is warm and dry.  Psychiatric: He has a normal mood and affect.  Nursing note and vitals reviewed.   ED Course  Procedures (including critical care time) Labs Review Labs Reviewed  POCT URINALYSIS DIP (DEVICE)   Results for orders placed or performed during the hospital encounter of 03/27/15  POCT urinalysis dip (device)  Result Value Ref Range   Glucose, UA NEGATIVE NEGATIVE mg/dL   Bilirubin Urine NEGATIVE NEGATIVE   Ketones, ur NEGATIVE NEGATIVE mg/dL   Specific Gravity, Urine 1.020 1.005 - 1.030   Hgb urine dipstick NEGATIVE NEGATIVE   pH 5.5 5.0 - 8.0   Protein, ur NEGATIVE NEGATIVE mg/dL   Urobilinogen, UA 0.2 0.0 - 1.0 mg/dL   Nitrite NEGATIVE NEGATIVE   Leukocytes, UA NEGATIVE NEGATIVE    Imaging Review No results found.   MDM   1. Epididymitis, left    Doxy 100 bid Warm compresses Discussed sx's of  Torsion, go to the eED for new or worsening sx's. See your doctor as needed, may return    Janne Napoleon, NP 03/27/15 [phone]

## 2015-03-27 NOTE — Discharge Instructions (Signed)
Epididymitis °Epididymitis is a swelling (inflammation) of the epididymis. The epididymis is a cord-like structure along the back part of the testicle. Epididymitis is usually, but not always, caused by infection. This is usually a sudden problem beginning with chills, fever and pain behind the scrotum and in the testicle. There may be swelling and redness of the testicle. °DIAGNOSIS  °Physical examination will reveal a tender, swollen epididymis. Sometimes, cultures are obtained from the urine or from prostate secretions to help find out if there is an infection or if the cause is a different problem. Sometimes, blood work is performed to see if your white blood cell count is elevated and if a germ (bacterial) or viral infection is present. Using this knowledge, an appropriate medicine which kills germs (antibiotic) can be chosen by your caregiver. A viral infection causing epididymitis will most often go away (resolve) without treatment. °HOME CARE INSTRUCTIONS  °· Hot sitz baths for 20 minutes, 4 times per day, may help relieve pain. °· Only take over-the-counter or prescription medicines for pain, discomfort or fever as directed by your caregiver. °· Take all medicines, including antibiotics, as directed. Take the antibiotics for the full prescribed length of time even if you are feeling better. °· It is very important to keep all follow-up appointments. °SEEK IMMEDIATE MEDICAL CARE IF:  °· You have a fever. °· You have pain not relieved with medicines. °· You have any worsening of your problems. °· Your pain seems to come and go. °· You develop pain, redness, and swelling in the scrotum and surrounding areas. °MAKE SURE YOU:  °· Understand these instructions. °· Will watch your condition. °· Will get help right away if you are not doing well or get worse. °Document Released: 11/04/2000 Document Revised: 01/30/2012 Document Reviewed: 09/24/2009 °[employer]® Patient Information ©2015 [employer]. This information  is not intended to replace advice given to you by your health care provider. Make sure you discuss any questions you have with your health care provider. ° °

## 2015-04-06 ENCOUNTER — Ambulatory Visit: Payer: BLUE CROSS/BLUE SHIELD | Admitting: Family

## 2015-04-07 ENCOUNTER — Encounter: Payer: Self-pay | Admitting: Family

## 2015-04-07 ENCOUNTER — Ambulatory Visit (INDEPENDENT_AMBULATORY_CARE_PROVIDER_SITE_OTHER): Payer: BLUE CROSS/BLUE SHIELD | Admitting: Family

## 2015-04-07 VITALS — BP 102/72 | HR 68 | Temp 98.3°F | Resp 18 | Ht 71.0 in | Wt 224.1 lb

## 2015-04-07 DIAGNOSIS — N508 Other specified disorders of male genital organs: Secondary | ICD-10-CM | POA: Diagnosis not present

## 2015-04-07 DIAGNOSIS — N50812 Left testicular pain: Secondary | ICD-10-CM | POA: Insufficient documentation

## 2015-04-07 MED ORDER — LEVOFLOXACIN 500 MG PO TABS: 500.0000 mg | ORAL_TABLET | Freq: Every day | ORAL | Status: DC

## 2015-04-07 NOTE — Assessment & Plan Note (Signed)
Symptoms and exam consistent with epididymitis that is refractory to doxycycline. Start Levaquin. Refer to urology for further assessment as needed. Follow-up if symptoms worsen or fail to improve.

## 2015-04-07 NOTE — Progress Notes (Signed)
   Subjective:    Patient ID: Terrance Ortiz, male    DOB: 1956/09/19, 59 y.o.   MRN: [id number]  Chief Complaint  Patient presents with  . Urinary issues    wants to see a urologist, thinks he may have an infection, having constant sharp pain in testicle area    HPI:  Terrance Ortiz is a 59 y.o. male with a PMH of Barrett's esophagus, obesity, and former smoker who presents today for an acute office visit.   Recently seen at urgent care and diagnosed with epididymitis following presentation of left testicular pain that started after walking a golf course. He was treated with doxycycline and instructed seek further medical care if symptoms of torsion develop.  Experienced improvements while taking the doxycyline and then noticed yesterday during the last dose the associated symptoms pain located in his left testicle. Not as severe as the last time, but is exacerbated by placing his foot on the ground. Describes the pain as dull and achy with a severity of 5-8/10 with constant pain. Modifying factors include Aleve.   No Known Allergies   No current outpatient prescriptions on file prior to visit.   No current facility-administered medications on file prior to visit.    Review of Systems  Constitutional: Negative for fever and chills.  Genitourinary: Positive for testicular pain. Negative for discharge, penile swelling, scrotal swelling, difficulty urinating and penile pain.      Objective:    BP 102/72 mmHg  Pulse 68  Temp(Src) 98.3 F (36.8 C) (Oral)  Resp 18  Ht 5\' 11"  (1.803 m)  Wt 224 lb 1.9 oz (101.66 kg)  BMI 31.27 kg/m2  SpO2 97% Nursing note and vital signs reviewed.  Physical Exam  Constitutional: He is oriented to person, place, and time. He appears well-developed and well-nourished. No distress.  Cardiovascular: Normal rate, regular rhythm, normal heart sounds and intact distal pulses.   Pulmonary/Chest: Effort normal and breath sounds normal.    Genitourinary:  No obvious edema or discoloration or inflammation noted. Palpable tenderness of left testicle in area of the epididymis. No masses appreciated  Neurological: He is alert and oriented to person, place, and time.  Skin: Skin is warm and dry.  Psychiatric: He has a normal mood and affect. His behavior is normal. Judgment and thought content normal.       Assessment & Plan:

## 2015-04-07 NOTE — Patient Instructions (Addendum)
Thank you for choosing [facility].  Summary/Instructions:  Your prescription(s) have been submitted to your pharmacy or been printed and provided for you. Please take as directed and contact our office if you believe you are having problem(s) with the medication(s) or have any questions.  Referrals have been made during this visit. You should expect to hear back from our schedulers in about 7-10 days in regards to establishing an appointment with the specialists we discussed.   If your symptoms worsen or fail to improve, please contact our office for further instruction, or in case of emergency go directly to the emergency room at the closest medical facility.    Epididymitis Epididymitis is a swelling (inflammation) of the epididymis. The epididymis is a cord-like structure along the back part of the testicle. Epididymitis is usually, but not always, caused by infection. This is usually a sudden problem beginning with chills, fever and pain behind the scrotum and in the testicle. There may be swelling and redness of the testicle. DIAGNOSIS  Physical examination will reveal a tender, swollen epididymis. Sometimes, cultures are obtained from the urine or from prostate secretions to help find out if there is an infection or if the cause is a different problem. Sometimes, blood work is performed to see if your white blood cell count is elevated and if a germ (bacterial) or viral infection is present. Using this knowledge, an appropriate medicine which kills germs (antibiotic) can be chosen by your caregiver. A viral infection causing epididymitis will most often go away (resolve) without treatment. HOME CARE INSTRUCTIONS   Hot sitz baths for 20 minutes, 4 times per day, may help relieve pain.  Only take over-the-counter or prescription medicines for pain, discomfort or fever as directed by your caregiver.  Take all medicines, including antibiotics, as directed. Take the antibiotics for the  full prescribed length of time even if you are feeling better.  It is very important to keep all follow-up appointments. SEEK IMMEDIATE MEDICAL CARE IF:   You have a fever.  You have pain not relieved with medicines.  You have any worsening of your problems.  Your pain seems to come and go.  You develop pain, redness, and swelling in the scrotum and surrounding areas. MAKE SURE YOU:   Understand these instructions.  Will watch your condition.  Will get help right away if you are not doing well or get worse. Document Released: 11/04/2000 Document Revised: 01/30/2012 Document Reviewed: 09/24/2009 [facility] Patient Information 2015 [place], [place]. This information is not intended to replace advice given to you by your health care provider. Make sure you discuss any questions you have with your health care provider.

## 2015-04-07 NOTE — Progress Notes (Signed)
Pre visit review using our clinic review tool, if applicable. No additional management support is needed unless otherwise documented below in the visit note. 

## 2015-05-04 ENCOUNTER — Telehealth: Payer: Self-pay | Admitting: Internal Medicine

## 2015-05-04 ENCOUNTER — Ambulatory Visit (INDEPENDENT_AMBULATORY_CARE_PROVIDER_SITE_OTHER): Payer: BLUE CROSS/BLUE SHIELD | Admitting: Emergency Medicine

## 2015-05-04 ENCOUNTER — Ambulatory Visit
Admission: RE | Admit: 2015-05-04 | Discharge: 2015-05-04 | Disposition: A | Discharge status: Home / Self Care | Payer: BLUE CROSS/BLUE SHIELD | Source: Ambulatory Visit | Attending: Emergency Medicine | Admitting: Emergency Medicine

## 2015-05-04 VITALS — BP 116/72 | HR 69 | Temp 98.6°F | Resp 18 | Ht 72.0 in | Wt 224.0 lb

## 2015-05-04 DIAGNOSIS — R1031 Right lower quadrant pain: Secondary | ICD-10-CM | POA: Diagnosis not present

## 2015-05-04 DIAGNOSIS — R109 Unspecified abdominal pain: Secondary | ICD-10-CM

## 2015-05-04 DIAGNOSIS — R1011 Right upper quadrant pain: Secondary | ICD-10-CM

## 2015-05-04 LAB — POCT CBC
Granulocyte percent: 67.3 %G (ref 37–80)
HCT, POC: 42.9 % — AB (ref 43.5–53.7)
Hemoglobin: 15 g/dL (ref 14.1–18.1)
Lymph, poc: 1.8 (ref 0.6–3.4)
MCH: 32.2 pg — AB (ref 27–31.2)
MCHC: 35.1 g/dL (ref 31.8–35.4)
MCV: 91.7 fL (ref 80–97)
MID (cbc): 0.3 (ref 0–0.9)
MPV: 8.8 fL (ref 0–99.8)
PLATELET COUNT, POC: 154 10*3/uL (ref 142–424)
POC Granulocyte: 4.3 (ref 2–6.9)
POC LYMPH PERCENT: 27.6 %L (ref 10–50)
POC MID %: 5.1 %M (ref 0–12)
RBC: 4.67 M/uL — AB (ref 4.69–6.13)
RDW, POC: 12.5 %
WBC: 6.4 10*3/uL (ref 4.6–10.2)

## 2015-05-04 LAB — POCT URINALYSIS DIPSTICK
BILIRUBIN UA: NEGATIVE
Blood, UA: NEGATIVE
GLUCOSE UA: NEGATIVE
KETONES UA: NEGATIVE
LEUKOCYTES UA: NEGATIVE
Nitrite, UA: NEGATIVE
PH UA: 5.5
Protein, UA: NEGATIVE
Spec Grav, UA: 1.02
Urobilinogen, UA: 0.2

## 2015-05-04 LAB — POCT UA - MICROSCOPIC ONLY
Bacteria, U Microscopic: NEGATIVE
Casts, Ur, LPF, POC: NEGATIVE
Crystals, Ur, HPF, POC: NEGATIVE
EPITHELIAL CELLS, URINE PER MICROSCOPY: NEGATIVE
Mucus, UA: NEGATIVE
Yeast, UA: NEGATIVE

## 2015-05-04 MED ORDER — IOPAMIDOL (ISOVUE-300) INJECTION 61%
125.0000 mL | Freq: Once | INTRAVENOUS | Status: AC | PRN
  Administered 2015-05-04: 125 mL via INTRAVENOUS

## 2015-05-04 MED ORDER — HYDROCODONE-ACETAMINOPHEN 5-325 MG PO TABS: 1.0000 | ORAL_TABLET | ORAL | Status: DC | PRN

## 2015-05-04 NOTE — Patient Instructions (Addendum)
Go to [facility] [address] Ph# [phone] today at 2:00pm. You will need to start the oral contrast 2 hours prior to your appt time. You will need to drink the 1st bottle at 12:00pm and the 2nd bottle at 1:00pm. Abdominal Pain Many things can cause abdominal pain. Usually, abdominal pain is not caused by a disease and will improve without treatment. It can often be observed and treated at home. Your health care provider will do a physical exam and possibly order blood tests and X-rays to help determine the seriousness of your pain. However, in many cases, more time must pass before a clear cause of the pain can be found. Before that point, your health care provider may not know if you need more testing or further treatment. HOME CARE INSTRUCTIONS  Monitor your abdominal pain for any changes. The following actions may help to alleviate any discomfort you are experiencing:  Only take over-the-counter or prescription medicines as directed by your health care provider.  Do not take laxatives unless directed to do so by your health care provider.  Try a clear liquid diet (broth, tea, or water) as directed by your health care provider. Slowly move to a bland diet as tolerated. SEEK MEDICAL CARE IF:  You have unexplained abdominal pain.  You have abdominal pain associated with nausea or diarrhea.  You have pain when you urinate or have a bowel movement.  You experience abdominal pain that wakes you in the night.  You have abdominal pain that is worsened or improved by eating food.  You have abdominal pain that is worsened with eating fatty foods.  You have a fever. SEEK IMMEDIATE MEDICAL CARE IF:   Your pain does not go away within 2 hours.  You keep throwing up (vomiting).  Your pain is felt only in portions of the abdomen, such as the right side or the left lower portion of the abdomen.  You pass bloody or black tarry stools. MAKE SURE YOU:  Understand these  instructions.   Will watch your condition.   Will get help right away if you are not doing well or get worse.  Document Released: 08/17/2005 Document Revised: 11/12/2013 Document Reviewed: 07/17/2013 [facility] Patient Information 2015 [place], [place]. This information is not intended to replace advice given to you by your health care provider. Make sure you discuss any questions you have with your health care provider.

## 2015-05-04 NOTE — Telephone Encounter (Signed)
Patient Name: Terrance Ortiz DOB: Apr 07, 1956 Initial Comment caller states he has severe upper abdominal pain and swelling Nurse Assessment Nurse: Ronnald Ramp, RN, Miranda Date/Time (Eastern Time): 05/04/2015 8:21:45 AM Confirm and document reason for call. If symptomatic, describe symptoms. ---Caller states he has swelling and pain in his left upper abdomen since Saturday night. Denies vomiting or diarrhea (since Friday). Denies fever. Has the patient traveled out of the country within the last 30 days? ---No Does the patient require triage? ---Yes Related visit to physician within the last 2 weeks? ---No Does the PT have any chronic conditions? (i.e. diabetes, asthma, etc.) ---Yes List chronic conditions. ---GERD Guidelines Guideline Title Affirmed Question Affirmed Notes Abdominal Pain - Upper [1] Pain lasts > 10 minutes AND [2] age > 64 Final Disposition User Go to ED Now Ronnald Ramp, RN, [employer]

## 2015-05-04 NOTE — Progress Notes (Signed)
Subjective:  Patient ID: Terrance Ortiz, male    DOB: 1956-07-05  Age: 59 y.o. MRN: [id number]  CC: Abdominal Pain and Edema   HPI Noble Cicalese presents  with abdominal pain. He said that Saturday he had the beginnings of left flank and left side pain. He and it's under current treatment for epididymitis with Levaquin. He denies any fever chills nausea vomiting stool change dysuria urgency or frequency no hematuria no black or bloody stools. He underwent a colonoscopy in December. He said this was normal with exception of polyps. He has noticed an increase in pain that interfered with sleep around 4:00 this morning. His pain has worsened. Is not radiating. Located in the left flank and left CVA. Has no improvement with over-the-counter medication.  History Milano has a past medical history of Varicella; Knee pain; Obesity; Sleep disorder; GERD (gastroesophageal reflux disease); Barrett's esophagus; and Tubular adenoma of colon (09/2011).   He has past surgical history that includes fracture collar bone; Vasectomy (1995); Colonoscopy; Upper gastrointestinal endoscopy; Tonsillectomy; laparoscopic appendectomy (N/A, 01/17/2015); and Appendectomy.   His  family history includes Cancer in his father; Esophageal cancer in his father. There is no history of Diabetes, Hypertension, Hyperlipidemia, Heart disease, Colon cancer, Rectal cancer, or Stomach cancer.  He   reports that he quit smoking about 4 years ago. He has never used smokeless tobacco. He reports that he drinks alcohol. He reports that he does not use illicit drugs.  Outpatient Prescriptions Prior to Visit  Medication Sig Dispense Refill  . levofloxacin (LEVAQUIN) 500 MG tablet Take 1 tablet (500 mg total) by mouth daily. 10 tablet 0   No facility-administered medications prior to visit.    History   Social History  . Marital Status: Married    Spouse Name: N/A  . Number of Children: 2  . Years of Education: 16    Occupational History  . Real Estate     Social History Main Topics  . Smoking status: Former Smoker -- 0.30 packs/day for 20 years    Quit date: 01/11/2011  . Smokeless tobacco: Never Used  . Alcohol Use: 0.0 oz/week     Comment: wine weekly  . Drug Use: No  . Sexual Activity:    Partners: Female   Other Topics Concern  . None   Social History Narrative   HSG, [employer] for a while then finished [employer]. BS- Bus Admin. Married - '84. 1 dtr - '94, 1 son '93. 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             Do you drink Alcoholic Beverages-yes. Do you drink caffienated Beverages-no. Do you use seatbelt often-yes. Do you exercise at least 3 times a week-no. Is there a smoke alarm in your house-yes. Have you experienced physical abuse-no         Usual # of hours of sleep per night 6-7                             Review of Systems  Constitutional: Negative for fever, chills and appetite change.  HENT: Negative for congestion, ear pain, postnasal drip, sinus pressure and sore throat.   Eyes: Negative for pain and redness.  Respiratory: Negative for cough, shortness of breath and wheezing.   Cardiovascular: Negative for leg swelling.  Gastrointestinal: Positive for abdominal pain. Negative for nausea, vomiting, diarrhea, constipation and blood in stool.  Endocrine: Negative  for polyuria.  Genitourinary: Positive for flank pain. Negative for dysuria, urgency and frequency.  Musculoskeletal: Negative for gait problem.  Skin: Negative for rash.  Neurological: Negative for weakness and headaches.  Psychiatric/Behavioral: Negative for confusion and decreased concentration. The patient is not nervous/anxious.     Objective:  BP 116/72 mmHg  Pulse 69  Temp(Src) 98.6 F (37 C) (Oral)  Resp 18  Ht 6' (1.829 m)  Wt 224 lb (101.606 kg)  BMI 30.37 kg/m2  SpO2 99%  Physical Exam  Constitutional: He is oriented to person, place, and time.  He appears well-developed and well-nourished. No distress.  HENT:  Head: Normocephalic and atraumatic.  Right Ear: External ear normal.  Left Ear: External ear normal.  Nose: Nose normal.  Eyes: Conjunctivae and EOM are normal. Pupils are equal, round, and reactive to light. No scleral icterus.  Neck: Normal range of motion. Neck supple. No tracheal deviation present.  Cardiovascular: Normal rate, regular rhythm and normal heart sounds.   Pulmonary/Chest: Effort normal. No respiratory distress. He has no wheezes. He has no rales.  Abdominal: He exhibits no mass. There is tenderness (left flank and CVA). There is guarding. There is no rebound.  Musculoskeletal: He exhibits no edema.  Lymphadenopathy:    He has no cervical adenopathy.  Neurological: He is alert and oriented to person, place, and time. Coordination normal.  Skin: Skin is warm and dry. No rash noted.  Psychiatric: He has a normal mood and affect. His behavior is normal.      Assessment & Plan:   There are no diagnoses linked to this encounter. I am having Mr. Stmarie maintain his levofloxacin and omeprazole.  Meds ordered this encounter  Medications  . omeprazole (PRILOSEC) 20 MG capsule    Sig: Take 20 mg by mouth daily.   CT is negative for tics or GB, stones.   Appropriate red flag conditions were discussed with the patient as well as actions that should be taken.  Patient expressed his understanding.  Follow-up: No Follow-up on file.  Roselee Culver, MD   Results for orders placed or performed in visit on 05/04/15  POCT urinalysis dipstick  Result Value Ref Range   Color, UA yellow    Clarity, UA clear    Glucose, UA neg    Bilirubin, UA neg    Ketones, UA neg    Spec Grav, UA 1.020    Blood, UA neg    pH, UA 5.5    Protein, UA neg    Urobilinogen, UA 0.2    Nitrite, UA neg    Leukocytes, UA Negative   POCT UA - Microscopic Only  Result Value Ref Range   WBC, Ur, HPF, POC 0-3    RBC,  urine, microscopic 0-1    Bacteria, U Microscopic neg    Mucus, UA neg    Epithelial cells, urine per micros neg    Crystals, Ur, HPF, POC neg    Casts, Ur, LPF, POC neg    Yeast, UA neg    Results for orders placed or performed in visit on 05/04/15  POCT urinalysis dipstick  Result Value Ref Range   Color, UA yellow    Clarity, UA clear    Glucose, UA neg    Bilirubin, UA neg    Ketones, UA neg    Spec Grav, UA 1.020    Blood, UA neg    pH, UA 5.5    Protein, UA neg    Urobilinogen, UA  0.2    Nitrite, UA neg    Leukocytes, UA Negative   POCT UA - Microscopic Only  Result Value Ref Range   WBC, Ur, HPF, POC 0-3    RBC, urine, microscopic 0-1    Bacteria, U Microscopic neg    Mucus, UA neg    Epithelial cells, urine per micros neg    Crystals, Ur, HPF, POC neg    Casts, Ur, LPF, POC neg    Yeast, UA neg   POCT CBC  Result Value Ref Range   WBC 6.4 4.6 - 10.2 K/uL   Lymph, poc 1.8 0.6 - 3.4   POC LYMPH PERCENT 27.6 10 - 50 %L   MID (cbc) 0.3 0 - 0.9   POC MID % 5.1 0 - 12 %M   POC Granulocyte 4.3 2 - 6.9   Granulocyte percent 67.3 37 - 80 %G   RBC 4.67 (A) 4.69 - 6.13 M/uL   Hemoglobin 15.0 14.1 - 18.1 g/dL   HCT, POC 42.9 (A) 43.5 - 53.7 %   MCV 91.7 80 - 97 fL   MCH, POC 32.2 (A) 27 - 31.2 pg   MCHC 35.1 31.8 - 35.4 g/dL   RDW, POC 12.5 %   Platelet Count, POC 154 142 - 424 K/uL   MPV 8.8 0 - 99.8 fL

## 2015-05-05 ENCOUNTER — Telehealth: Payer: Self-pay | Admitting: *Deleted

## 2015-05-05 NOTE — Telephone Encounter (Signed)
[facility] Client [facility] Patient Name: Terrance Ortiz Gender: Male DOB: 03/27/1956 Age: 59 Y 76 M 27 D Return Phone Number: [phone] (Primary) Address: City/State/Zip: [place] Client  [facility] - Client Client Site [place] - Day Physician [place], [place] Type Call Call Type Triage / Clinical Relationship To Patient Self Appointment Disposition EMR Appointment Not Necessary Info pasted into Epic Yes Return Phone Number [phone] (Primary) Chief Complaint ABDOMINAL PAIN - Severe and only in abdomen Initial Comment caller states he has severe upper abdominal pain and swelling PreDisposition Go to Urgent Care/Walk-In Clinic Nurse Assessment Nurse: Ronnald Ramp, RN, Miranda Date/Time (Eastern Time): 05/04/2015 8:21:45 AM Confirm and document reason for call. If symptomatic, describe symptoms. ---Caller states he has swelling and pain in his left upper abdomen since Saturday night. Denies vomiting or diarrhea (since Friday). Denies fever. Has the patient traveled out of the country within the last 30 days? ---No Does the patient require triage? ---Yes Related visit to physician within the last 2 weeks? ---No Does the PT have any chronic conditions? (i.e. diabetes, asthma, etc.) ---Yes List chronic conditions. ---GERD Guidelines Guideline Title Affirmed Question Affirmed Notes Nurse Date/Time Eilene Ghazi Time) Abdominal Pain - Upper [1] Pain lasts > 10 minutes AND [2] age > 12 Ronnald Ramp, RN, Miranda 05/04/2015 8:24:48 AM Disp. Time Eilene Ghazi Time) Disposition Final User 05/04/2015 8:20:44 AM Send to Urgent Queue [facility] 05/04/2015 8:27:03 AM Go to ED Now Yes Ronnald Ramp, RN, [facility]all Understands: Yes Disagree/Comply: Comply PLEASE NOTE: All timestamps contained within this report are represented as [place] Standard Time. CONFIDENTIALTY NOTICE: This fax  transmission is intended only for the addressee. It contains information that is legally privileged, confidential or otherwise protected from use or disclosure. If you are not the intended recipient, you are strictly prohibited from reviewing, disclosing, copying using or disseminating any of this information or taking any action in reliance on or regarding this information. If you have received this fax in error, please notify us immediately by telephone so that we can arrange for its return to [place]. Phone: [phone], Toll-Free: [phone], Fax: [phone] Page: 2 of 2 Call Id: [id number] Care Advice Given Per Guideline GO TO ED NOW: You need to be seen in the Emergency Department. Go to the ER at ___________ [place] now. Drive carefully. DRIVING: Another adult should drive. Do not delay going to the Emergency Department. If immediate transportation is not available via car or taxi, then the patient should be instructed to call [id number]. BRING MEDICINES: * Please bring a list of your current medicines when you go to the Emergency Department (ER). * It is also a good idea to bring the pill bottles too. This will help the doctor to make certain you are taking the right medicines and the right dose. * Remain NPO until seen. CARE ADVICE given per Abdominal Pain, Upper (Adult) guideline. CALL EMS 911 IF: * Passes out or becomes too weak to stand * Severe difficulty breathing occurs. After Care Instructions Given Call Event Type User Date / Time Description [facility]errals [facility]

## 2015-06-09 NOTE — Progress Notes (Signed)
Pt's prescription for Hydrocodone from his last OV was never picked up. I shredded it

## 2016-01-16 ENCOUNTER — Other Ambulatory Visit: Payer: Self-pay | Admitting: Acute Care

## 2016-01-16 DIAGNOSIS — Z87891 Personal history of nicotine dependence: Secondary | ICD-10-CM

## 2016-01-22 ENCOUNTER — Ambulatory Visit (INDEPENDENT_AMBULATORY_CARE_PROVIDER_SITE_OTHER)
Admission: RE | Admit: 2016-01-22 | Discharge: 2016-01-22 | Disposition: A | Discharge status: Home / Self Care | Payer: BLUE CROSS/BLUE SHIELD | Source: Ambulatory Visit | Attending: Acute Care | Admitting: Acute Care

## 2016-01-22 DIAGNOSIS — Z87891 Personal history of nicotine dependence: Secondary | ICD-10-CM | POA: Diagnosis not present

## 2016-01-25 ENCOUNTER — Other Ambulatory Visit: Payer: Self-pay | Admitting: Acute Care

## 2016-01-25 ENCOUNTER — Other Ambulatory Visit: Payer: Self-pay | Admitting: Geriatric Medicine

## 2016-01-25 ENCOUNTER — Telehealth: Payer: Self-pay | Admitting: Internal Medicine

## 2016-01-25 DIAGNOSIS — Z87891 Personal history of nicotine dependence: Secondary | ICD-10-CM

## 2016-01-25 MED ORDER — OMEPRAZOLE 20 MG PO CPDR: 20.0000 mg | DELAYED_RELEASE_CAPSULE | Freq: Every day | ORAL | Status: DC

## 2016-01-25 NOTE — Telephone Encounter (Signed)
Sent to pharmacy 

## 2016-01-25 NOTE — Telephone Encounter (Signed)
Pt called in and said that he needs a refill of omeprazole (PRILOSEC) 20 MG capsule [id number] to  Pharmacy on file , just till he can come in for cpe in April

## 2016-02-12 ENCOUNTER — Other Ambulatory Visit: Payer: Self-pay | Admitting: Internal Medicine

## 2016-03-10 ENCOUNTER — Ambulatory Visit (INDEPENDENT_AMBULATORY_CARE_PROVIDER_SITE_OTHER): Payer: BLUE CROSS/BLUE SHIELD | Admitting: Internal Medicine

## 2016-03-10 ENCOUNTER — Other Ambulatory Visit (INDEPENDENT_AMBULATORY_CARE_PROVIDER_SITE_OTHER): Payer: BLUE CROSS/BLUE SHIELD

## 2016-03-10 ENCOUNTER — Encounter: Payer: Self-pay | Admitting: Internal Medicine

## 2016-03-10 VITALS — BP 124/82 | HR 55 | Temp 98.8°F | Resp 16 | Ht 72.0 in | Wt 205.0 lb

## 2016-03-10 DIAGNOSIS — Z0000 Encounter for general adult medical examination without abnormal findings: Secondary | ICD-10-CM

## 2016-03-10 DIAGNOSIS — E669 Obesity, unspecified: Secondary | ICD-10-CM

## 2016-03-10 DIAGNOSIS — Z87891 Personal history of nicotine dependence: Secondary | ICD-10-CM

## 2016-03-10 LAB — LIPID PANEL
Cholesterol: 155 mg/dL (ref 0–200)
HDL: 43.1 mg/dL (ref >39.00)
LDL Cholesterol: 92 mg/dL (ref 0–99)
NONHDL: 111.83
TRIGLYCERIDES: 99 mg/dL (ref 0.0–149.0)
Total CHOL/HDL Ratio: 4
VLDL: 19.8 mg/dL (ref 0.0–40.0)

## 2016-03-10 LAB — COMPREHENSIVE METABOLIC PANEL
ALK PHOS: 72 U/L (ref 39–117)
ALT: 48 U/L (ref 0–53)
AST: 22 U/L (ref 0–37)
Albumin: 4.8 g/dL (ref 3.5–5.2)
BILIRUBIN TOTAL: 0.9 mg/dL (ref 0.2–1.2)
BUN: 17 mg/dL (ref 6–23)
CO2: 31 mEq/L (ref 19–32)
CREATININE: 0.8 mg/dL (ref 0.40–1.50)
Calcium: 10.3 mg/dL (ref 8.4–10.5)
Chloride: 103 mEq/L (ref 96–112)
GFR: 105.4 mL/min (ref >60.00)
GLUCOSE: 107 mg/dL — AB (ref 70–99)
Potassium: 4.8 mEq/L (ref 3.5–5.1)
Sodium: 139 mEq/L (ref 135–145)
TOTAL PROTEIN: 7.2 g/dL (ref 6.0–8.3)

## 2016-03-10 LAB — CBC
HEMATOCRIT: 48.6 % (ref 39.0–52.0)
Hemoglobin: 16.7 g/dL (ref 13.0–17.0)
MCHC: 34.3 g/dL (ref 30.0–36.0)
MCV: 92.5 fl (ref 78.0–100.0)
Platelets: 196 10*3/uL (ref 150.0–400.0)
RBC: 5.25 Mil/uL (ref 4.22–5.81)
RDW: 12.6 % (ref 11.5–15.5)
WBC: 6 10*3/uL (ref 4.0–10.5)

## 2016-03-10 LAB — HEMOGLOBIN A1C: HEMOGLOBIN A1C: 5.4 % (ref 4.6–6.5)

## 2016-03-10 LAB — HEPATITIS C ANTIBODY: HCV Ab: NEGATIVE

## 2016-03-10 MED ORDER — OMEPRAZOLE 20 MG PO CPDR: 20.0000 mg | DELAYED_RELEASE_CAPSULE | Freq: Every day | ORAL | Status: DC

## 2016-03-10 NOTE — Progress Notes (Signed)
Pre visit review using our clinic review tool, if applicable. No additional management support is needed unless otherwise documented below in the visit note. 

## 2016-03-10 NOTE — Patient Instructions (Signed)
If you want to try decreasing the omeprazole to every other day for about 1 month and see if you have any symptoms of the heartburn. If you do go back to taking daily. If you do not have any symptoms you can try to stop altogether.   Health Maintenance, Male A healthy lifestyle and preventative care can promote health and wellness.  Maintain regular health, dental, and eye exams.  Eat a healthy diet. Foods like vegetables, fruits, whole grains, low-fat dairy products, and lean protein foods contain the nutrients you need and are low in calories. Decrease your intake of foods high in solid fats, added sugars, and salt. Get information about a proper diet from your health care provider, if necessary.  Regular physical exercise is one of the most important things you can do for your health. Most adults should get at least 150 minutes of moderate-intensity exercise (any activity that increases your heart rate and causes you to sweat) each week. In addition, most adults need muscle-strengthening exercises on 2 or more days a week.   Maintain a healthy weight. The body mass index (BMI) is a screening tool to identify possible weight problems. It provides an estimate of body fat based on height and weight. Your health care provider can find your BMI and can help you achieve or maintain a healthy weight. For males 20 years and older:  A BMI below 18.5 is considered underweight.  A BMI of 18.5 to 24.9 is normal.  A BMI of 25 to 29.9 is considered overweight.  A BMI of 30 and above is considered obese.  Maintain normal blood lipids and cholesterol by exercising and minimizing your intake of saturated fat. Eat a balanced diet with plenty of fruits and vegetables. Blood tests for lipids and cholesterol should begin at age 90 and be repeated every 5 years. If your lipid or cholesterol levels are high, you are over age 77, or you are at high risk for heart disease, you may need your cholesterol levels  checked more frequently.Ongoing high lipid and cholesterol levels should be treated with medicines if diet and exercise are not working.  If you smoke, find out from your health care provider how to quit. If you do not use tobacco, do not start.  Lung cancer screening is recommended for adults aged 2-80 years who are at high risk for developing lung cancer because of a history of smoking. A yearly low-dose CT scan of the lungs is recommended for people who have at least a 30-pack-year history of smoking and are current smokers or have quit within the past 15 years. A pack year of smoking is smoking an average of 1 pack of cigarettes a day for 1 year (for example, a 30-pack-year history of smoking could mean smoking 1 pack a day for 30 years or 2 packs a day for 15 years). Yearly screening should continue until the smoker has stopped smoking for at least 15 years. Yearly screening should be stopped for people who develop a health problem that would prevent them from having lung cancer treatment.  If you choose to drink alcohol, do not have more than 2 drinks per day. One drink is considered to be 12 oz (360 mL) of beer, 5 oz (150 mL) of wine, or 1.5 oz (45 mL) of liquor.  Avoid the use of street drugs. Do not share needles with anyone. Ask for help if you need support or instructions about stopping the use of drugs.  High blood  pressure causes heart disease and increases the risk of stroke. High blood pressure is more likely to develop in:  People who have blood pressure in the end of the normal range (100-139/85-89 mm Hg).  People who are overweight or obese.  People who are African American.  If you are 38-3 years of age, have your blood pressure checked every 3-5 years. If you are 75 years of age or older, have your blood pressure checked every year. You should have your blood pressure measured twice--once when you are at a hospital or clinic, and once when you are not at a hospital or clinic.  Record the average of the two measurements. To check your blood pressure when you are not at a hospital or clinic, you can use:  An automated blood pressure machine at a pharmacy.  A home blood pressure monitor.  If you are 59-43 years old, ask your health care provider if you should take aspirin to prevent heart disease.  Diabetes screening involves taking a blood sample to check your fasting blood sugar level. This should be done once every 3 years after age 41 if you are at a normal weight and without risk factors for diabetes. Testing should be considered at a younger age or be carried out more frequently if you are overweight and have at least 1 risk factor for diabetes.  Colorectal cancer can be detected and often prevented. Most routine colorectal cancer screening begins at the age of 36 and continues through age 39. However, your health care provider may recommend screening at an earlier age if you have risk factors for colon cancer. On a yearly basis, your health care provider may provide home test kits to check for hidden blood in the stool. A small camera at the end of a tube may be used to directly examine the colon (sigmoidoscopy or colonoscopy) to detect the earliest forms of colorectal cancer. Talk to your health care provider about this at age 75 when routine screening begins. A direct exam of the colon should be repeated every 5-10 years through age 50, unless early forms of precancerous polyps or small growths are found.  People who are at an increased risk for hepatitis B should be screened for this virus. You are considered at high risk for hepatitis B if:  You were born in a country where hepatitis B occurs often. Talk with your health care provider about which countries are considered high risk.  Your parents were born in a high-risk country and you have not received a shot to protect against hepatitis B (hepatitis B vaccine).  You have HIV or AIDS.  You use needles to  inject street drugs.  You live with, or have sex with, someone who has hepatitis B.  You are a man who has sex with other men (MSM).  You get hemodialysis treatment.  You take certain medicines for conditions like cancer, organ transplantation, and autoimmune conditions.  Hepatitis C blood testing is recommended for all people born from [birth year] through 1965 and any individual with known risk factors for hepatitis C.  Healthy men should no longer receive prostate-specific antigen (PSA) blood tests as part of routine cancer screening. Talk to your health care provider about prostate cancer screening.  Testicular cancer screening is not recommended for adolescents or adult males who have no symptoms. Screening includes self-exam, a health care provider exam, and other screening tests. Consult with your health care provider about any symptoms you have or any concerns you  have about testicular cancer.  Practice safe sex. Use condoms and avoid high-risk sexual practices to reduce the spread of sexually transmitted infections (STIs).  You should be screened for STIs, including gonorrhea and chlamydia if:  You are sexually active and are younger than 24 years.  You are older than 24 years, and your health care provider tells you that you are at risk for this type of infection.  Your sexual activity has changed since you were last screened, and you are at an increased risk for chlamydia or gonorrhea. Ask your health care provider if you are at risk.  If you are at risk of being infected with HIV, it is recommended that you take a prescription medicine daily to prevent HIV infection. This is called pre-exposure prophylaxis (PrEP). You are considered at risk if:  You are a man who has sex with other men (MSM).  You are a heterosexual man who is sexually active with multiple partners.  You take drugs by injection.  You are sexually active with a partner who has HIV.  Talk with your health care  provider about whether you are at high risk of being infected with HIV. If you choose to begin PrEP, you should first be tested for HIV. You should then be tested every 3 months for as long as you are taking PrEP.  Use sunscreen. Apply sunscreen liberally and repeatedly throughout the day. You should seek shade when your shadow is shorter than you. Protect yourself by wearing long sleeves, pants, a wide-brimmed hat, and sunglasses year round whenever you are outdoors.  Tell your health care provider of new moles or changes in moles, especially if there is a change in shape or color. Also, tell your health care provider if a mole is larger than the size of a pencil eraser.  A one-time screening for abdominal aortic aneurysm (AAA) and surgical repair of large AAAs by ultrasound is recommended for men aged 59-75 years who are current or former smokers.  Stay current with your vaccines (immunizations).   This information is not intended to replace advice given to you by your health care provider. Make sure you discuss any questions you have with your health care provider.   Document Released: 05/05/2008 Document Revised: 11/28/2014 Document Reviewed: 04/04/2011 Elsevier Interactive Patient Education [employer].

## 2016-03-11 NOTE — Assessment & Plan Note (Signed)
Colonoscopy repeat due later this year, immunizations up to date and checking labs. Non-smoker and exercising some. Counseled on sun protection. Given screening recommendations.

## 2016-03-11 NOTE — Progress Notes (Signed)
   Subjective:    Patient ID: Terrance Ortiz, male    DOB: [birth year], 60 y.o.   MRN: [id number]  HPI The patient is a 60 YO man coming in for wellness. No new concerns. Still not smoking and not exercising much.   PMH, [facility], social history reviewed and updated.   Review of Systems  Constitutional: Negative for fever, chills, appetite change, fatigue and unexpected weight change.  HENT: Negative.   Respiratory: Negative for cough, chest tightness, shortness of breath and wheezing.   Cardiovascular: Negative for chest pain, palpitations and leg swelling.  Gastrointestinal: Negative for abdominal pain, diarrhea, constipation and abdominal distention.  Musculoskeletal: Positive for arthralgias. Negative for myalgias and back pain.  Skin: Negative.   Neurological: Negative for dizziness, weakness, light-headedness and headaches.  Psychiatric/Behavioral: Negative.       Objective:   Physical Exam  Constitutional: He is oriented to person, place, and time. He appears well-developed and well-nourished.  overweight  HENT:  Head: Normocephalic and atraumatic.  Eyes: EOM are normal.  Neck: Normal range of motion.  Cardiovascular: Normal rate and regular rhythm.   Pulmonary/Chest: Effort normal and breath sounds normal. No respiratory distress. He has no wheezes. He has no rales.  Abdominal: Soft. Bowel sounds are normal. He exhibits no distension. There is no tenderness. There is no rebound.  Musculoskeletal: He exhibits no edema.  Neurological: He is alert and oriented to person, place, and time. Coordination normal.  Skin: Skin is warm and dry.   Filed Vitals:   03/10/16 1043  BP: 124/82  Pulse: 55  Temp: 98.8 F (37.1 C)  TempSrc: Oral  Resp: 16  Height: 6' (1.829 m)  Weight: 205 lb (92.987 kg)  SpO2: 97%      Assessment & Plan:

## 2016-03-11 NOTE — Assessment & Plan Note (Signed)
Still not smoking and reminded about the health risks from smoking.

## 2016-03-11 NOTE — Assessment & Plan Note (Signed)
Reminded to keep working on more exercise.

## 2016-09-09 ENCOUNTER — Encounter: Payer: Self-pay | Admitting: Gastroenterology

## 2017-01-12 ENCOUNTER — Encounter: Payer: Self-pay | Admitting: Gastroenterology

## 2017-02-03 ENCOUNTER — Telehealth: Payer: Self-pay | Admitting: Acute Care

## 2017-02-06 NOTE — Telephone Encounter (Signed)
Spoke with pt.  He is calling to schedule his yearly f/u low dose ct.  I advised pt that I will have Rodena Piety to call him to schedule.  Pt verbalized understanding.  Nothing further needed.

## 2017-02-16 ENCOUNTER — Ambulatory Visit (INDEPENDENT_AMBULATORY_CARE_PROVIDER_SITE_OTHER)
Admission: RE | Admit: 2017-02-16 | Discharge: 2017-02-16 | Disposition: A | Discharge status: Home / Self Care | Payer: BLUE CROSS/BLUE SHIELD | Source: Ambulatory Visit | Attending: Acute Care | Admitting: Acute Care

## 2017-02-16 DIAGNOSIS — Z87891 Personal history of nicotine dependence: Secondary | ICD-10-CM

## 2017-02-16 DIAGNOSIS — R911 Solitary pulmonary nodule: Secondary | ICD-10-CM | POA: Diagnosis not present

## 2017-02-23 ENCOUNTER — Ambulatory Visit (AMBULATORY_SURGERY_CENTER): Payer: Self-pay

## 2017-02-23 VITALS — Ht 71.0 in | Wt 215.2 lb

## 2017-02-23 DIAGNOSIS — Z860100 Personal history of colon polyps, unspecified: Secondary | ICD-10-CM

## 2017-02-23 MED ORDER — SUPREP BOWEL PREP KIT 17.5-3.13-1.6 GM/177ML PO SOLN: 1.0000 | Freq: Once | ORAL | 0 refills | Status: AC

## 2017-02-23 NOTE — Progress Notes (Signed)
No allergies to eggs or soy No home oxygen No diet meds No past problems with anesthesia  Registered emmi 

## 2017-02-28 ENCOUNTER — Other Ambulatory Visit: Payer: Self-pay | Admitting: Acute Care

## 2017-02-28 DIAGNOSIS — Z87891 Personal history of nicotine dependence: Secondary | ICD-10-CM

## 2017-03-09 ENCOUNTER — Encounter: Payer: BLUE CROSS/BLUE SHIELD | Admitting: Gastroenterology

## 2017-03-13 ENCOUNTER — Ambulatory Visit (INDEPENDENT_AMBULATORY_CARE_PROVIDER_SITE_OTHER): Payer: BLUE CROSS/BLUE SHIELD | Admitting: Internal Medicine

## 2017-03-13 ENCOUNTER — Other Ambulatory Visit (INDEPENDENT_AMBULATORY_CARE_PROVIDER_SITE_OTHER): Payer: BLUE CROSS/BLUE SHIELD

## 2017-03-13 ENCOUNTER — Encounter: Payer: Self-pay | Admitting: Internal Medicine

## 2017-03-13 VITALS — BP 120/78 | HR 61 | Temp 98.9°F | Resp 12 | Ht 71.0 in | Wt 213.0 lb

## 2017-03-13 DIAGNOSIS — Z0000 Encounter for general adult medical examination without abnormal findings: Secondary | ICD-10-CM

## 2017-03-13 LAB — COMPREHENSIVE METABOLIC PANEL
ALK PHOS: 79 U/L (ref 39–117)
ALT: 36 U/L (ref 0–53)
AST: 20 U/L (ref 0–37)
Albumin: 4.7 g/dL (ref 3.5–5.2)
BILIRUBIN TOTAL: 0.9 mg/dL (ref 0.2–1.2)
BUN: 16 mg/dL (ref 6–23)
CO2: 29 meq/L (ref 19–32)
Calcium: 9.8 mg/dL (ref 8.4–10.5)
Chloride: 103 mEq/L (ref 96–112)
Creatinine, Ser: 0.75 mg/dL (ref 0.40–1.50)
GFR: 113.16 mL/min (ref >60.00)
GLUCOSE: 109 mg/dL — AB (ref 70–99)
Potassium: 4.7 mEq/L (ref 3.5–5.1)
SODIUM: 137 meq/L (ref 135–145)
TOTAL PROTEIN: 6.9 g/dL (ref 6.0–8.3)

## 2017-03-13 LAB — CBC
HEMATOCRIT: 49.5 % (ref 39.0–52.0)
Hemoglobin: 16.9 g/dL (ref 13.0–17.0)
MCHC: 34.1 g/dL (ref 30.0–36.0)
MCV: 94.7 fl (ref 78.0–100.0)
Platelets: 181 10*3/uL (ref 150.0–400.0)
RBC: 5.23 Mil/uL (ref 4.22–5.81)
RDW: 12.5 % (ref 11.5–15.5)
WBC: 6.5 10*3/uL (ref 4.0–10.5)

## 2017-03-13 LAB — LIPID PANEL
CHOL/HDL RATIO: 4
Cholesterol: 176 mg/dL (ref 0–200)
HDL: 45.3 mg/dL (ref >39.00)
LDL Cholesterol: 104 mg/dL — ABNORMAL HIGH (ref 0–99)
NONHDL: 130.57
Triglycerides: 135 mg/dL (ref 0.0–149.0)
VLDL: 27 mg/dL (ref 0.0–40.0)

## 2017-03-13 LAB — HEMOGLOBIN A1C: HEMOGLOBIN A1C: 5.5 % (ref 4.6–6.5)

## 2017-03-13 NOTE — Progress Notes (Signed)
   Subjective:    Patient ID: Terrance Ortiz, male    DOB: [birth year], 61 y.o.   MRN: [id number]  HPI The patient is a 61 YO man coming in for wellness. No new concerns. Walking 3-4 miles per day.   PMH, [facility], social history reviewed and updated.   Review of Systems  Constitutional: Negative.   HENT: Negative.   Eyes: Negative.   Respiratory: Negative for cough, chest tightness and shortness of breath.   Cardiovascular: Negative for chest pain, palpitations and leg swelling.  Gastrointestinal: Negative for abdominal distention, abdominal pain, constipation, diarrhea, nausea and vomiting.  Musculoskeletal: Negative.   Skin: Negative.   Neurological: Negative.   Psychiatric/Behavioral: Negative.       Objective:   Physical Exam  Constitutional: He is oriented to person, place, and time. He appears well-developed and well-nourished.  HENT:  Head: Normocephalic and atraumatic.  Eyes: EOM are normal.  Neck: Normal range of motion.  Cardiovascular: Normal rate and regular rhythm.   Pulmonary/Chest: Effort normal and breath sounds normal. No respiratory distress. He has no wheezes. He has no rales.  Abdominal: Soft. Bowel sounds are normal. He exhibits no distension. There is no tenderness. There is no rebound.  Musculoskeletal: He exhibits no edema.  Neurological: He is alert and oriented to person, place, and time. Coordination normal.  Skin: Skin is warm and dry.  Psychiatric: He has a normal mood and affect.   Vitals:   03/13/17 0859  BP: 120/78  Pulse: 61  Resp: 12  Temp: 98.9 F (37.2 C)  TempSrc: Oral  SpO2: 98%  Weight: 213 lb (96.6 kg)  Height: 5\' 11"  (1.803 m)      Assessment & Plan:

## 2017-03-13 NOTE — Patient Instructions (Signed)
We will check the blood work and get you the results.   Ask the insurance about the shingles vaccine called shingrix to prevent shingles.    Health Maintenance, Male A healthy lifestyle and preventive care is important for your health and wellness. Ask your health care provider about what schedule of regular examinations is right for you. What should I know about weight and diet?  Eat a Healthy Diet  Eat plenty of vegetables, fruits, whole grains, low-fat dairy products, and lean protein.  Do not eat a lot of foods high in solid fats, added sugars, or salt. Maintain a Healthy Weight  Regular exercise can help you achieve or maintain a healthy weight. You should:  Do at least 150 minutes of exercise each week. The exercise should increase your heart rate and make you sweat (moderate-intensity exercise).  Do strength-training exercises at least twice a week. Watch Your Levels of Cholesterol and Blood Lipids  Have your blood tested for lipids and cholesterol every 5 years starting at 60 years of age. If you are at high risk for heart disease, you should start having your blood tested when you are 60 years old. You may need to have your cholesterol levels checked more often if:  Your lipid or cholesterol levels are high.  You are older than 60 years of age.  You are at high risk for heart disease. What should I know about cancer screening? Many types of cancers can be detected early and may often be prevented. Lung Cancer  You should be screened every year for lung cancer if:  You are a current smoker who has smoked for at least 30 years.  You are a former smoker who has quit within the past 15 years.  Talk to your health care provider about your screening options, when you should start screening, and how often you should be screened. Colorectal Cancer  Routine colorectal cancer screening usually begins at 60 years of age and should be repeated every 5-10 years until you are 60  years old. You may need to be screened more often if early forms of precancerous polyps or small growths are found. Your health care provider may recommend screening at an earlier age if you have risk factors for colon cancer.  Your health care provider may recommend using home test kits to check for hidden blood in the stool.  A small camera at the end of a tube can be used to examine your colon (sigmoidoscopy or colonoscopy). This checks for the earliest forms of colorectal cancer. Prostate and Testicular Cancer  Depending on your age and overall health, your health care provider may do certain tests to screen for prostate and testicular cancer.  Talk to your health care provider about any symptoms or concerns you have about testicular or prostate cancer. Skin Cancer  Check your skin from head to toe regularly.  Tell your health care provider about any new moles or changes in moles, especially if:  There is a change in a mole's size, shape, or color.  You have a mole that is larger than a pencil eraser.  Always use sunscreen. Apply sunscreen liberally and repeat throughout the day.  Protect yourself by wearing long sleeves, pants, a wide-brimmed hat, and sunglasses when outside. What should I know about heart disease, diabetes, and high blood pressure?  If you are 43-39 years of age, have your blood pressure checked every 3-5 years. If you are 21 years of age or older, have your blood  pressure checked every year. You should have your blood pressure measured twice-once when you are at a hospital or clinic, and once when you are not at a hospital or clinic. Record the average of the two measurements. To check your blood pressure when you are not at a hospital or clinic, you can use:  An automated blood pressure machine at a pharmacy.  A home blood pressure monitor.  Talk to your health care provider about your target blood pressure.  If you are between 59-43 years old, ask your  health care provider if you should take aspirin to prevent heart disease.  Have regular diabetes screenings by checking your fasting blood sugar level.  If you are at a normal weight and have a low risk for diabetes, have this test once every three years after the age of 72.  If you are overweight and have a high risk for diabetes, consider being tested at a younger age or more often.  A one-time screening for abdominal aortic aneurysm (AAA) by ultrasound is recommended for men aged 11-75 years who are current or former smokers. What should I know about preventing infection? Hepatitis B  If you have a higher risk for hepatitis B, you should be screened for this virus. Talk with your health care provider to find out if you are at risk for hepatitis B infection. Hepatitis C  Blood testing is recommended for:  Everyone born from 50 through 1965.  Anyone with known risk factors for hepatitis C. Sexually Transmitted Diseases (STDs)  You should be screened each year for STDs including gonorrhea and chlamydia if:  You are sexually active and are younger than 60 years of age.  You are older than 60 years of age and your health care provider tells you that you are at risk for this type of infection.  Your sexual activity has changed since you were last screened and you are at an increased risk for chlamydia or gonorrhea. Ask your health care provider if you are at risk.  Talk with your health care provider about whether you are at high risk of being infected with HIV. Your health care provider may recommend a prescription medicine to help prevent HIV infection. What else can I do?  Schedule regular health, dental, and eye exams.  Stay current with your vaccines (immunizations).  Do not use any tobacco products, such as cigarettes, chewing tobacco, and e-cigarettes. If you need help quitting, ask your health care provider.  Limit alcohol intake to no more than 2 drinks per day. One drink  equals 12 ounces of beer, 5 ounces of wine, or 1 ounces of hard liquor.  Do not use street drugs.  Do not share needles.  Ask your health care provider for help if you need support or information about quitting drugs.  Tell your health care provider if you often feel depressed.  Tell your health care provider if you have ever been abused or do not feel safe at home. This information is not intended to replace advice given to you by your health care provider. Make sure you discuss any questions you have with your health care provider. Document Released: 05/05/2008 Document Revised: 07/06/2016 Document Reviewed: 08/11/2015 Elsevier Interactive Patient Education  2017 [employer].

## 2017-03-13 NOTE — Progress Notes (Signed)
Pre visit review using our clinic review tool, if applicable. No additional management support is needed unless otherwise documented below in the visit note. 

## 2017-03-13 NOTE — Assessment & Plan Note (Signed)
Getting colonoscopy in May for update, declines need for hiv screening. Tetanus and flu up to date. Counseled about shingrix vaccine and he will look into. Counseled about sun safety and mole surveillance as well as dangers of distracted driving. Given screening recommendations.

## 2017-04-12 ENCOUNTER — Encounter: Payer: Self-pay | Admitting: Gastroenterology

## 2017-04-12 ENCOUNTER — Ambulatory Visit (AMBULATORY_SURGERY_CENTER): Payer: BLUE CROSS/BLUE SHIELD | Admitting: Gastroenterology

## 2017-04-12 VITALS — BP 119/63 | HR 71 | Temp 97.8°F | Resp 20 | Ht 71.0 in | Wt 215.0 lb

## 2017-04-12 DIAGNOSIS — Z86010 Personal history of colonic polyps: Secondary | ICD-10-CM

## 2017-04-12 DIAGNOSIS — D123 Benign neoplasm of transverse colon: Secondary | ICD-10-CM | POA: Diagnosis not present

## 2017-04-12 MED ORDER — SODIUM CHLORIDE 0.9 % IV SOLN: 500.0000 mL | INTRAVENOUS | Status: DC

## 2017-04-12 NOTE — Op Note (Addendum)
[facility] Patient Name: Terrance Ortiz Procedure Date: 04/12/2017 1:16 PM MRN: [id number] Endoscopist: Ladene Artist , MD Age: 61 Referring MD:  Date of Birth: Nov 09, 1956 Gender: Male Account #: [id number] Procedure:                Colonoscopy Indications:              Surveillance: Personal history of adenomatous                            polyps on last colonoscopy 5 years ago Medicines:                Monitored Anesthesia Care Procedure:                Pre-Anesthesia Assessment:                           - Prior to the procedure, a History and Physical                            was performed, and patient medications and                            allergies were reviewed. The patient's tolerance of                            previous anesthesia was also reviewed. The risks                            and benefits of the procedure and the sedation                            options and risks were discussed with the patient.                            All questions were answered, and informed consent                            was obtained. Prior Anticoagulants: The patient has                            taken no previous anticoagulant or antiplatelet                            agents. ASA Grade Assessment: II - A patient with                            mild systemic disease. After reviewing the risks                            and benefits, the patient was deemed in                            satisfactory condition to undergo the procedure.  After obtaining informed consent, the colonoscope                            was passed under direct vision. Throughout the                            procedure, the patient's blood pressure, pulse, and                            oxygen saturations were monitored continuously. The                            Model PCF-[id number]) scope was introduced                            through the anus and  advanced to the the cecum,                            identified by appendiceal orifice and ileocecal                            valve. The ileocecal valve, appendiceal orifice,                            and rectum were photographed. The quality of the                            bowel preparation was excellent. The colonoscopy                            was performed without difficulty. The patient                            tolerated the procedure well. Scope In: 1:35:41 PM Scope Out: 1:52:03 PM Scope Withdrawal Time: 0 hours 13 minutes 32 seconds  Total Procedure Duration: 0 hours 16 minutes 22 seconds  Findings:                 The perianal and digital rectal examinations were                            normal.                           A 7 mm polyp was found in the transverse colon. The                            polyp was sessile. The polyp was removed with a                            cold snare. Resection and retrieval were complete.                           The exam was otherwise without abnormality on  direct and retroflexion views. Complications:            No immediate complications. Estimated blood loss:                            None. Estimated Blood Loss:     Estimated blood loss: none. Impression:               - One 7 mm polyp in the transverse colon, removed                            with a cold snare. Resected and retrieved.                           - The examination was otherwise normal on direct                            and retroflexion views. Recommendation:           - Repeat colonoscopy in 5 years for surveillance.                           - Patient has a contact number available for                            emergencies. The signs and symptoms of potential                            delayed complications were discussed with the                            patient. Return to normal activities tomorrow.                             Written discharge instructions were provided to the                            patient.                           - Resume previous diet.                           - Continue present medications.                           - Await pathology results. Ladene Artist, MD 04/12/2017 2:01:55 PM This report has been signed electronically.

## 2017-04-12 NOTE — Progress Notes (Signed)
A and O x3. Report to RN. Tolerated MAC anesthesia well.

## 2017-04-12 NOTE — Progress Notes (Signed)
Pt's states no medical or surgical changes since previsit or office visit. 

## 2017-04-12 NOTE — Progress Notes (Signed)
Called to room to assist during endoscopic procedure.  Patient ID and intended procedure confirmed with present staff. Received instructions for my participation in the procedure from the performing physician.  

## 2017-04-12 NOTE — Patient Instructions (Signed)
YOU HAD AN ENDOSCOPIC PROCEDURE TODAY AT [facility]:   Refer to the procedure report that was given to you for any specific questions about what was found during the examination.  If the procedure report does not answer your questions, please call your gastroenterologist to clarify.  If you requested that your care partner not be given the details of your procedure findings, then the procedure report has been included in a sealed envelope for you to review at your convenience later.  YOU SHOULD EXPECT: Some feelings of bloating in the abdomen. Passage of more gas than usual.  Walking can help get rid of the air that was put into your GI tract during the procedure and reduce the bloating. If you had a lower endoscopy (such as a colonoscopy or flexible sigmoidoscopy) you may notice spotting of blood in your stool or on the toilet paper. If you underwent a bowel prep for your procedure, you may not have a normal bowel movement for a few days.  Please Note:  You might notice some irritation and congestion in your nose or some drainage.  This is from the oxygen used during your procedure.  There is no need for concern and it should clear up in a day or so.  SYMPTOMS TO REPORT IMMEDIATELY:   Following lower endoscopy (colonoscopy or flexible sigmoidoscopy):  Excessive amounts of blood in the stool  Significant tenderness or worsening of abdominal pains  Swelling of the abdomen that is new, acute  Fever of 100F or higher   For urgent or emergent issues, a gastroenterologist can be reached at any hour by calling [phone].   DIET:  We do recommend a small meal at first, but then you may proceed to your regular diet.  Drink plenty of fluids but you should avoid alcoholic beverages for 24 hours.  ACTIVITY:  You should plan to take it easy for the rest of today and you should NOT DRIVE or use heavy machinery until tomorrow (because of the sedation medicines used during the test).     FOLLOW UP: Our staff will call the number listed on your records the next business day following your procedure to check on you and address any questions or concerns that you may have regarding the information given to you following your procedure. If we do not reach you, we will leave a message.  However, if you are feeling well and you are not experiencing any problems, there is no need to return our call.  We will assume that you have returned to your regular daily activities without incident.  If any biopsies were taken you will be contacted by phone or by letter within the next 1-3 weeks.  Please call us at [phone] if you have not heard about the biopsies in 3 weeks.   Polyps (handout given) Hemorrhoids (handout given) Await for biopsy results to determined next repeat Colonoscopy screening   SIGNATURES/CONFIDENTIALITY: You and/or your care partner have signed paperwork which will be entered into your electronic medical record.  These signatures attest to the fact that that the information above on your After Visit Summary has been reviewed and is understood.  Full responsibility of the confidentiality of this discharge information lies with you and/or your care-partner.

## 2017-04-13 ENCOUNTER — Telehealth: Payer: Self-pay | Admitting: *Deleted

## 2017-04-13 NOTE — Telephone Encounter (Signed)
  Follow up Call-  Call back number 04/12/2017 10/30/2014  Post procedure Call Back phone  # [phone]  Permission to leave phone message Yes Yes  Some recent data might be hidden     Patient questions:  Do you have a fever, pain , or abdominal swelling? No. Pain Score  0 *  Have you tolerated food without any problems? Yes.    Have you been able to return to your normal activities? Yes.    Do you have any questions about your discharge instructions: Diet   No. Medications  No. Follow up visit  No.  Do you have questions or concerns about your Care? No.  Actions: * If pain score is 4 or above: No action needed, pain <4.

## 2017-04-17 ENCOUNTER — Other Ambulatory Visit: Payer: Self-pay | Admitting: Internal Medicine

## 2017-04-20 ENCOUNTER — Encounter: Payer: Self-pay | Admitting: Gastroenterology

## 2017-05-04 ENCOUNTER — Telehealth: Payer: Self-pay | Admitting: Internal Medicine

## 2017-05-04 NOTE — Telephone Encounter (Signed)
Patient needs visit.

## 2017-05-04 NOTE — Telephone Encounter (Signed)
Pt called stating that he was interested in trying a sleep aid. He has been having trouble staying asleep. He did see Dr Sharlet Salina on April 23rd but he said that this is a new issue.

## 2017-05-04 NOTE — Telephone Encounter (Signed)
Appointment scheduled.

## 2017-05-05 ENCOUNTER — Encounter: Payer: Self-pay | Admitting: Internal Medicine

## 2017-05-05 ENCOUNTER — Ambulatory Visit (INDEPENDENT_AMBULATORY_CARE_PROVIDER_SITE_OTHER): Payer: BLUE CROSS/BLUE SHIELD | Admitting: Internal Medicine

## 2017-05-05 VITALS — BP 116/78 | HR 67 | Ht 71.0 in | Wt 214.0 lb

## 2017-05-05 DIAGNOSIS — G4700 Insomnia, unspecified: Secondary | ICD-10-CM

## 2017-05-05 MED ORDER — ZOLPIDEM TARTRATE ER 12.5 MG PO TBCR: 12.5000 mg | EXTENDED_RELEASE_TABLET | Freq: Every evening | ORAL | 1 refills | Status: DC | PRN

## 2017-05-05 NOTE — Patient Instructions (Signed)
Please take all new medication as prescribed - the [facility] as needed  Please continue all other medications as before, and refills have been done if requested.  Please have the pharmacy call with any other refills you may need.  Please continue your efforts at being more active, low cholesterol diet, and weight control.  Please keep your appointments with your specialists as you may have planned

## 2017-05-05 NOTE — Progress Notes (Signed)
   Subjective:    Patient ID: Terrance Ortiz, male    DOB: Sep 23, 1956, 61 y.o.   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  Diet no real change. Has regular exercise with a purpose 4-5 miles per episode, and ends up about 1`00 miles per mo.   Pt denies fever, wt loss, night sweats, loss of appetite, or other constitutional symptoms   Past Medical History:  Diagnosis Date  . Barrett's esophagus   . GERD (gastroesophageal reflux disease)   . Knee pain    recurrent intermittent tendonitis  . Obesity   . Sleep disorder   . Tubular adenoma of colon 09/2011  . Varicella    Past Surgical History:  Procedure Laterality Date  . APPENDECTOMY    . COLONOSCOPY    . fracture collar bone     no surgery  . LAPAROSCOPIC APPENDECTOMY N/A 01/17/2015   Procedure: APPENDECTOMY LAPAROSCOPIC;  Surgeon: Doreen Salvage, MD;  Location: [facility];  Service: General;  Laterality: N/A;  . TONSILLECTOMY     1975  . UPPER GASTROINTESTINAL ENDOSCOPY    . VASECTOMY  1995  . WISDOM TOOTH EXTRACTION      reports that he quit smoking about 6 years ago. He has a 6.00 pack-year smoking history. He has never used smokeless tobacco. He reports that he drinks about 8.4 oz of alcohol per week . He reports that he does not use drugs. family history includes Cancer in his father; Esophageal cancer in his father. No Known Allergies Current Outpatient Prescriptions on File Prior to Visit  Medication Sig Dispense Refill  . omeprazole (PRILOSEC) 20 MG capsule TAKE 1 CAPSULE (20 MG TOTAL) BY MOUTH DAILY. 90 capsule 3   No  current facility-administered medications on file prior to visit.    Review of Systems All otherwise neg per pt    Objective:   Physical Exam BP 116/78   Pulse 67   Ht 5\' 11"  (1.803 m)   Wt 214 lb (97.1 kg)   SpO2 99%   BMI 29.85 kg/m  VS noted, not ill appearing Constitutional: Pt appears in NAD HENT: Head: NCAT.  Right Ear: External ear normal.  Left Ear: External ear normal.  Eyes: . Pupils are equal, round, and reactive to light. Conjunctivae and EOM are normal Nose: without d/c or deformity Neck: Neck supple. Gross normal ROM Cardiovascular: Normal rate and regular rhythm.   Pulmonary/Chest: Effort normal and breath sounds without rales or wheezing.  Neurological: Pt is alert. At baseline orientation, motor grossly intact Skin: Skin is warm. No rashes, other new lesions, no LE edema Psychiatric: Pt behavior is normal without agitation , mild stressed demeanor, not depressed affect    Assessment & Plan:

## 2017-05-06 DIAGNOSIS — G4700 Insomnia, unspecified: Secondary | ICD-10-CM | POA: Insufficient documentation

## 2017-05-06 NOTE — Assessment & Plan Note (Signed)
With persistent early am awakening primary issue, denies depression but is fatigued, for ambien qhs prn,  to f/u any worsening symptoms or concerns

## 2017-07-03 ENCOUNTER — Telehealth: Payer: Self-pay | Admitting: Internal Medicine

## 2017-07-04 MED ORDER — TRAZODONE HCL 50 MG PO TABS: 25.0000 mg | ORAL_TABLET | Freq: Every evening | ORAL | 3 refills | Status: DC | PRN

## 2017-07-04 NOTE — Telephone Encounter (Signed)
Pt called in would like a call back asap on some side effects he is having on Ambein CR   Best number [phone]

## 2017-07-04 NOTE — Telephone Encounter (Signed)
Ok to change to trazodone qhs prn

## 2017-07-04 NOTE — Telephone Encounter (Signed)
Notified pt w/MD response. Rx has been sent to [facility].../[facility]

## 2017-07-04 NOTE — Telephone Encounter (Signed)
Called pt concerning msg below. He stated when Dr. Jenny Reichmann rx med for him back in June he inform him that the Ambien CR could make him feel like he has a hang over. Pt states this is how he is feeling and symptom last up til around lunch time. Pt is wondering if MD change to regular Lorrin Mais would he still have same effect if not would like rx sent to [facility]. Pls advise...Terrance Ortiz

## 2017-11-15 ENCOUNTER — Encounter: Payer: Self-pay | Admitting: Gastroenterology

## 2017-12-25 ENCOUNTER — Encounter: Payer: Self-pay | Admitting: Gastroenterology

## 2018-01-15 ENCOUNTER — Ambulatory Visit (AMBULATORY_SURGERY_CENTER): Payer: Self-pay

## 2018-01-15 ENCOUNTER — Other Ambulatory Visit: Payer: Self-pay

## 2018-01-15 VITALS — Ht 71.0 in | Wt 223.4 lb

## 2018-01-15 DIAGNOSIS — K2270 Barrett's esophagus without dysplasia: Secondary | ICD-10-CM

## 2018-01-15 NOTE — Progress Notes (Signed)
No egg or soy allergy known to patient  No issues with past sedation with any surgeries  or procedures, no intubation problems  No diet pills per patient No home 02 use per patient  No blood thinners per patient  Pt denies issues with constipation  No A fib or A flutter  EMMI video sent to pt's e mail pt declined   

## 2018-01-22 ENCOUNTER — Encounter: Payer: Self-pay | Admitting: Gastroenterology

## 2018-01-22 ENCOUNTER — Ambulatory Visit (AMBULATORY_SURGERY_CENTER): Payer: BLUE CROSS/BLUE SHIELD | Admitting: Gastroenterology

## 2018-01-22 ENCOUNTER — Other Ambulatory Visit: Payer: Self-pay

## 2018-01-22 VITALS — BP 133/72 | HR 56 | Temp 98.4°F | Resp 13 | Ht 71.0 in | Wt 223.0 lb

## 2018-01-22 DIAGNOSIS — K2270 Barrett's esophagus without dysplasia: Secondary | ICD-10-CM

## 2018-01-22 MED ORDER — SODIUM CHLORIDE 0.9 % IV SOLN: 500.0000 mL | Freq: Once | INTRAVENOUS | Status: DC

## 2018-01-22 NOTE — Progress Notes (Signed)
Called to room to assist during endoscopic procedure.  Patient ID and intended procedure confirmed with present staff. Received instructions for my participation in the procedure from the performing physician.  

## 2018-01-22 NOTE — Progress Notes (Signed)
No changes in medical or surgical hx since PV per pt 

## 2018-01-22 NOTE — Op Note (Signed)
[facility] Patient Name: Terrance Ortiz Procedure Date: 01/22/2018 10:01 AM MRN: [id number] Endoscopist: Ladene Artist , MD Age: 62 Referring MD:  Date of Birth: 1956-07-17 Gender: Male Account #: [id number] Procedure:                Upper GI endoscopy Indications:              Surveillance for malignancy due to personal history                            of Barrett's esophagus Medicines:                Monitored Anesthesia Care Procedure:                Pre-Anesthesia Assessment:                           - Prior to the procedure, a History and Physical                            was performed, and patient medications and                            allergies were reviewed. The patient's tolerance of                            previous anesthesia was also reviewed. The risks                            and benefits of the procedure and the sedation                            options and risks were discussed with the patient.                            All questions were answered, and informed consent                            was obtained. Prior Anticoagulants: The patient has                            taken no previous anticoagulant or antiplatelet                            agents. ASA Grade Assessment: II - A patient with                            mild systemic disease. After reviewing the risks                            and benefits, the patient was deemed in                            satisfactory condition to undergo the procedure.  After obtaining informed consent, the endoscope was                            passed under direct vision. Throughout the                            procedure, the patient's blood pressure, pulse, and                            oxygen saturations were monitored continuously. The                            Model GIF-HQ190 [id number]) scope was introduced                            through the mouth, and  advanced to the second part                            of duodenum. The upper GI endoscopy was                            accomplished without difficulty. The patient                            tolerated the procedure well. Scope In: Scope Out: Findings:                 There were esophageal mucosal changes secondary to                            established short-segment Barrett's disease present                            in the distal esophagus. The maximum longitudinal                            extent of these mucosal changes was 2 cm in length.                            Mucosa was biopsied with a cold forceps for                            histology in 4 quadrants at intervals of 1 cm in                            the lower third of the esophagus. One specimen                            bottle was sent to pathology.                           The exam of the esophagus was otherwise normal.  A small hiatal hernia was present.                           The exam of the stomach was otherwise normal.                           The duodenal bulb and second portion of the                            duodenum were normal. Complications:            No immediate complications. Estimated Blood Loss:     Estimated blood loss was minimal. Impression:               - Esophageal mucosal changes secondary to                            established short-segment Barrett's disease.                            Biopsied.                           - Small hiatal hernia.                           - Normal duodenal bulb and second portion of the                            duodenum. Recommendation:           - Patient has a contact number available for                            emergencies. The signs and symptoms of potential                            delayed complications were discussed with the                            patient. Return to normal activities tomorrow.                             Written discharge instructions were provided to the                            patient.                           - Resume previous diet.                           - Continue present medications.                           - Await pathology results.                           -  Repeat upper endoscopy in 3 years for                            surveillance if no dysplasia. Ladene Artist, MD 01/22/2018 10:27:56 AM This report has been signed electronically.

## 2018-01-22 NOTE — Patient Instructions (Signed)
YOU HAD AN ENDOSCOPIC PROCEDURE TODAY AT [facility]:   Refer to the procedure report that was given to you for any specific questions about what was found during the examination.  If the procedure report does not answer your questions, please call your gastroenterologist to clarify.  If you requested that your care partner not be given the details of your procedure findings, then the procedure report has been included in a sealed envelope for you to review at your convenience later.  YOU SHOULD EXPECT: Some feelings of bloating in the abdomen. Passage of more gas than usual.  Walking can help get rid of the air that was put into your GI tract during the procedure and reduce the bloating. If you had a lower endoscopy (such as a colonoscopy or flexible sigmoidoscopy) you may notice spotting of blood in your stool or on the toilet paper. If you underwent a bowel prep for your procedure, you may not have a normal bowel movement for a few days.  Please Note:  You might notice some irritation and congestion in your nose or some drainage.  This is from the oxygen used during your procedure.  There is no need for concern and it should clear up in a day or so.  SYMPTOMS TO REPORT IMMEDIATELY:     Following upper endoscopy (EGD)  Vomiting of blood or coffee ground material  New chest pain or pain under the shoulder blades  Painful or persistently difficult swallowing  New shortness of breath  Fever of 100F or higher  Black, tarry-looking stools  For urgent or emergent issues, a gastroenterologist can be reached at any hour by calling [phone].   DIET:  We do recommend a small meal at first, but then you may proceed to your regular diet.  Drink plenty of fluids but you should avoid alcoholic beverages for 24 hours.  ACTIVITY:  You should plan to take it easy for the rest of today and you should NOT DRIVE or use heavy machinery until tomorrow (because of the sedation medicines  used during the test).    FOLLOW UP: Our staff will call the number listed on your records the next business day following your procedure to check on you and address any questions or concerns that you may have regarding the information given to you following your procedure. If we do not reach you, we will leave a message.  However, if you are feeling well and you are not experiencing any problems, there is no need to return our call.  We will assume that you have returned to your regular daily activities without incident.  If any biopsies were taken you will be contacted by phone or by letter within the next 1-3 weeks.  Please call us at [phone] if you have not heard about the biopsies in 3 weeks.    SIGNATURES/CONFIDENTIALITY: You and/or your care partner have signed paperwork which will be entered into your electronic medical record.  These signatures attest to the fact that that the information above on your After Visit Summary has been reviewed and is understood.  Full responsibility of the confidentiality of this discharge information lies with you and/or your care-partner.   Resume medications. Information given on hiatal hernia.

## 2018-01-22 NOTE — Progress Notes (Signed)
Report to PACU, RN, vss, BBS= Clear.  

## 2018-01-23 ENCOUNTER — Telehealth: Payer: Self-pay | Admitting: *Deleted

## 2018-01-23 NOTE — Telephone Encounter (Signed)
  Follow up Call-  Call back number 01/22/2018 04/12/2017  Post procedure Call Back phone  # [phone]  Permission to leave phone message Yes Yes  Some recent data might be hidden     Patient questions:  Do you have a fever, pain , or abdominal swelling? No. Pain Score  0 *  Have you tolerated food without any problems? Yes.    Have you been able to return to your normal activities? Yes.    Do you have any questions about your discharge instructions: Diet   No. Medications  No. Follow up visit  No.  Do you have questions or concerns about your Care? No.  Actions: * If pain score is 4 or above: No action needed, pain <4.

## 2018-02-03 ENCOUNTER — Encounter: Payer: Self-pay | Admitting: Gastroenterology

## 2018-02-21 ENCOUNTER — Ambulatory Visit (INDEPENDENT_AMBULATORY_CARE_PROVIDER_SITE_OTHER)
Admission: RE | Admit: 2018-02-21 | Discharge: 2018-02-21 | Disposition: A | Discharge status: Home / Self Care | Payer: BLUE CROSS/BLUE SHIELD | Source: Ambulatory Visit | Attending: Acute Care | Admitting: Acute Care

## 2018-02-21 DIAGNOSIS — Z87891 Personal history of nicotine dependence: Secondary | ICD-10-CM

## 2018-02-23 ENCOUNTER — Other Ambulatory Visit: Payer: Self-pay | Admitting: Acute Care

## 2018-02-23 ENCOUNTER — Encounter: Payer: Self-pay | Admitting: Acute Care

## 2018-02-23 DIAGNOSIS — Z122 Encounter for screening for malignant neoplasm of respiratory organs: Secondary | ICD-10-CM

## 2018-02-23 DIAGNOSIS — Z87891 Personal history of nicotine dependence: Secondary | ICD-10-CM

## 2018-03-16 ENCOUNTER — Encounter: Payer: BLUE CROSS/BLUE SHIELD | Admitting: Internal Medicine

## 2018-03-28 ENCOUNTER — Encounter: Payer: BLUE CROSS/BLUE SHIELD | Admitting: Internal Medicine

## 2018-08-09 DIAGNOSIS — N451 Epididymitis: Secondary | ICD-10-CM | POA: Diagnosis not present

## 2018-11-05 ENCOUNTER — Encounter (HOSPITAL_COMMUNITY): Payer: Self-pay | Admitting: *Deleted

## 2018-11-05 ENCOUNTER — Ambulatory Visit (HOSPITAL_COMMUNITY)
Admission: EM | Admit: 2018-11-05 | Discharge: 2018-11-05 | Disposition: A | Discharge status: Home / Self Care | Payer: BLUE CROSS/BLUE SHIELD | Attending: Physician Assistant | Admitting: Physician Assistant

## 2018-11-05 ENCOUNTER — Other Ambulatory Visit: Payer: Self-pay

## 2018-11-05 DIAGNOSIS — H6983 Other specified disorders of Eustachian tube, bilateral: Secondary | ICD-10-CM | POA: Diagnosis not present

## 2018-11-05 MED ORDER — PREDNISONE 50 MG PO TABS: 50.0000 mg | ORAL_TABLET | Freq: Every day | ORAL | 0 refills | Status: DC

## 2018-11-05 NOTE — Discharge Instructions (Signed)
Continue flonase 2 spray each nostril daily for the next 7-15 days. Start prednisone as directed. Zyrtec for nasal congestion. Follow up with PCP for further evaluation if symptoms not improving.

## 2018-11-05 NOTE — ED Triage Notes (Signed)
States he has a cold onset 10 days ago and his ears are blocked.

## 2018-11-05 NOTE — ED Provider Notes (Addendum)
[facility]    CSN: [id number] Arrival date & time: 11/05/18  1431     History   Chief Complaint Chief Complaint  Patient presents with  . Ear Fullness    HPI Terrance Ortiz is a 62 y.o. male.   62 year old male comes in for bilateral ear fullness.  States has had URI symptoms including rhinorrhea, nasal congestion, cough.  Has been taking OTC medication with good relief.  Stopped OTC medication, and noticed ear fullness for the past few days.  Denies fever, chills, night sweats.  Denies ear pain, hearing loss.  Denies ear drainage.  Restarted OTC medications yesterday, without relief.  Used 1 dose of Flonase yesterday.     Past Medical History:  Diagnosis Date  . Barrett's esophagus   . GERD (gastroesophageal reflux disease)   . Knee pain    recurrent intermittent tendonitis  . Obesity   . Sleep disorder   . Tubular adenoma of colon 09/2011  . Varicella     Patient Active Problem List   Diagnosis Date Noted  . Insomnia 05/06/2017  . Barrett's esophagus 12/09/2014  . Routine health maintenance 08/26/2011  . Former smoker 08/24/2011    Past Surgical History:  Procedure Laterality Date  . APPENDECTOMY    . COLONOSCOPY    . fracture collar bone     no surgery  . LAPAROSCOPIC APPENDECTOMY N/A 01/17/2015   Procedure: APPENDECTOMY LAPAROSCOPIC;  Surgeon: Doreen Salvage, MD;  Location: [facility];  Service: General;  Laterality: N/A;  . TONSILLECTOMY     1975  . UPPER GASTROINTESTINAL ENDOSCOPY    . VASECTOMY  1995  . WISDOM TOOTH EXTRACTION         Home Medications    Prior to Admission medications   Medication Sig Start Date End Date Taking? Authorizing Provider  omeprazole (PRILOSEC) 20 MG capsule TAKE 1 CAPSULE (20 MG TOTAL) BY MOUTH DAILY. 04/18/17   Hoyt Koch, MD  predniSONE (DELTASONE) 50 MG tablet Take 1 tablet (50 mg total) by mouth daily. 11/05/18   Ok Edwards, PA-C    Family History Family History  Problem Relation Age of Onset    . Cancer Father        esophageal cancer-had mets but survived 65 yrs  . Esophageal cancer Father   . Diabetes Neg Hx   . Hypertension Neg Hx   . Hyperlipidemia Neg Hx   . Heart disease Neg Hx   . Colon cancer Neg Hx   . Rectal cancer Neg Hx   . Stomach cancer Neg Hx     Social History Social History   Tobacco Use  . Smoking status: Former Smoker    Packs/day: 1.00    Years: 30.00    Pack years: 30.00    Last attempt to quit: 01/11/2011    Years since quitting: 7.8  . Smokeless tobacco: Never Used  Substance Use Topics  . Alcohol use: Yes    Alcohol/week: 14.0 standard drinks    Types: 14 Glasses of wine per week    Comment:  5 days a week wine,beer  . Drug use: No     Allergies   Patient has no known allergies.   Review of Systems Review of Systems  Reason unable to perform ROS: See HPI as above.     Physical Exam Triage Vital Signs ED Triage Vitals  Enc Vitals Group     BP 11/05/18 1455 137/69     Pulse Rate 11/05/18 1455 67  Resp 11/05/18 1455 18     Temp 11/05/18 1455 98 F (36.7 C)     Temp Source 11/05/18 1455 Oral     SpO2 11/05/18 1455 98 %     Weight --      Height --      Head Circumference --      Peak Flow --      Pain Score 11/05/18 1457 0     Pain Loc --      Pain Edu? --      Excl. in [place]? --    No data found.  Updated Vital Signs BP 137/69 (BP Location: Right Arm)   Pulse 67   Temp 98 F (36.7 C) (Oral)   Resp 18   SpO2 98%   Physical Exam Constitutional:      General: He is not in acute distress.    Appearance: He is well-developed. He is not ill-appearing, toxic-appearing or diaphoretic.  HENT:     Head: Normocephalic and atraumatic.     Right Ear: Ear canal and external ear normal. A middle ear effusion is present. Tympanic membrane is not erythematous or bulging.     Left Ear: Tympanic membrane, ear canal and external ear normal. Tympanic membrane is not erythematous or bulging.     Nose: Nose normal.     Right  Sinus: No maxillary sinus tenderness or frontal sinus tenderness.     Left Sinus: No maxillary sinus tenderness or frontal sinus tenderness.     Mouth/Throat:     Pharynx: Uvula midline.  Eyes:     Conjunctiva/sclera: Conjunctivae normal.     Pupils: Pupils are equal, round, and reactive to light.  Neck:     Musculoskeletal: Normal range of motion and neck supple.  Cardiovascular:     Rate and Rhythm: Normal rate and regular rhythm.     Heart sounds: Normal heart sounds. No murmur. No friction rub. No gallop.   Pulmonary:     Effort: Pulmonary effort is normal.     Breath sounds: Normal breath sounds. No decreased breath sounds, wheezing, rhonchi or rales.  Lymphadenopathy:     Cervical: No cervical adenopathy.  Skin:    General: Skin is warm and dry.  Neurological:     Mental Status: He is alert and oriented to person, place, and time.  Psychiatric:        Behavior: Behavior normal.        Judgment: Judgment normal.      UC Treatments / Results  Labs (all labs ordered are listed, but only abnormal results are displayed) Labs Reviewed - No data to display  EKG None  Radiology No results found.  Procedures Procedures (including critical care time)  Medications Ordered in UC Medications - No data to display  Initial Impression / Assessment and Plan / UC Course  I have reviewed the triage vital signs and the nursing notes.  Pertinent labs & imaging results that were available during my care of the patient were reviewed by me and considered in my medical decision making (see chart for details).    No signs of otitis media on exam.  Will treat for eustachian tube dysfunction.  Patient to continue Flonase.  We will add prednisone as directed.  Other symptomatic treatment discussed.  Return precautions given.  Patient expresses understanding and agrees to plan.  Final Clinical Impressions(s) / UC Diagnoses   Final diagnoses:  Dysfunction of both eustachian tubes     ED Prescriptions  Medication Sig Dispense Auth. Provider   predniSONE (DELTASONE) 50 MG tablet Take 1 tablet (50 mg total) by mouth daily. 5 tablet Samuel Bouche 11/05/18 [place], [place] E[place] V, [place] 11/05/18 1541

## 2019-02-08 ENCOUNTER — Encounter: Payer: Self-pay | Admitting: Internal Medicine

## 2019-02-08 ENCOUNTER — Other Ambulatory Visit: Payer: Self-pay

## 2019-02-08 ENCOUNTER — Other Ambulatory Visit (INDEPENDENT_AMBULATORY_CARE_PROVIDER_SITE_OTHER): Payer: BLUE CROSS/BLUE SHIELD

## 2019-02-08 ENCOUNTER — Ambulatory Visit (INDEPENDENT_AMBULATORY_CARE_PROVIDER_SITE_OTHER): Payer: BLUE CROSS/BLUE SHIELD | Admitting: Internal Medicine

## 2019-02-08 VITALS — BP 102/70 | HR 60 | Temp 98.1°F | Ht 71.0 in | Wt 204.0 lb

## 2019-02-08 DIAGNOSIS — G4700 Insomnia, unspecified: Secondary | ICD-10-CM | POA: Diagnosis not present

## 2019-02-08 DIAGNOSIS — Z23 Encounter for immunization: Secondary | ICD-10-CM

## 2019-02-08 DIAGNOSIS — Z0000 Encounter for general adult medical examination without abnormal findings: Secondary | ICD-10-CM | POA: Diagnosis not present

## 2019-02-08 DIAGNOSIS — K2270 Barrett's esophagus without dysplasia: Secondary | ICD-10-CM

## 2019-02-08 LAB — COMPREHENSIVE METABOLIC PANEL
ALBUMIN: 4.8 g/dL (ref 3.5–5.2)
ALT: 32 U/L (ref 0–53)
AST: 17 U/L (ref 0–37)
Alkaline Phosphatase: 82 U/L (ref 39–117)
BUN: 14 mg/dL (ref 6–23)
CALCIUM: 10.2 mg/dL (ref 8.4–10.5)
CHLORIDE: 103 meq/L (ref 96–112)
CO2: 27 meq/L (ref 19–32)
Creatinine, Ser: 0.75 mg/dL (ref 0.40–1.50)
GFR: 105.79 mL/min (ref >60.00)
Glucose, Bld: 103 mg/dL — ABNORMAL HIGH (ref 70–99)
Potassium: 4.4 mEq/L (ref 3.5–5.1)
Sodium: 140 mEq/L (ref 135–145)
Total Bilirubin: 0.7 mg/dL (ref 0.2–1.2)
Total Protein: 7.3 g/dL (ref 6.0–8.3)

## 2019-02-08 LAB — CBC
HEMATOCRIT: 47.1 % (ref 39.0–52.0)
HEMOGLOBIN: 16.4 g/dL (ref 13.0–17.0)
MCHC: 34.9 g/dL (ref 30.0–36.0)
MCV: 94 fl (ref 78.0–100.0)
PLATELETS: 192 10*3/uL (ref 150.0–400.0)
RBC: 5.01 Mil/uL (ref 4.22–5.81)
RDW: 12.8 % (ref 11.5–15.5)
WBC: 5.6 10*3/uL (ref 4.0–10.5)

## 2019-02-08 LAB — LIPID PANEL
CHOL/HDL RATIO: 4
CHOLESTEROL: 155 mg/dL (ref 0–200)
HDL: 43.8 mg/dL (ref >39.00)
LDL CALC: 94 mg/dL (ref 0–99)
NonHDL: 111.25
TRIGLYCERIDES: 84 mg/dL (ref 0.0–149.0)
VLDL: 16.8 mg/dL (ref 0.0–40.0)

## 2019-02-08 LAB — HEMOGLOBIN A1C: Hgb A1c MFr Bld: 5.3 % (ref 4.6–6.5)

## 2019-02-08 MED ORDER — ZOLPIDEM TARTRATE ER 12.5 MG PO TBCR: 12.5000 mg | EXTENDED_RELEASE_TABLET | Freq: Every evening | ORAL | 1 refills | Status: DC | PRN

## 2019-02-08 NOTE — Patient Instructions (Addendum)
The EKG of the heart is normal and not changed from 8 years ago. We have given you the first shingles shot today so get the second one in about 2 months. We have sent in the ambien to use for sleep short term.    Health Maintenance, Male A healthy lifestyle and preventive care is important for your health and wellness. Ask your health care provider about what schedule of regular examinations is right for you. What should I know about weight and diet? Eat a Healthy Diet  Eat plenty of vegetables, fruits, whole grains, low-fat dairy products, and lean protein.  Do not eat a lot of foods high in solid fats, added sugars, or salt.  Maintain a Healthy Weight Regular exercise can help you achieve or maintain a healthy weight. You should:  Do at least 150 minutes of exercise each week. The exercise should increase your heart rate and make you sweat (moderate-intensity exercise).  Do strength-training exercises at least twice a week. Watch Your Levels of Cholesterol and Blood Lipids  Have your blood tested for lipids and cholesterol every 5 years starting at 62 years of age. If you are at high risk for heart disease, you should start having your blood tested when you are 62 years old. You may need to have your cholesterol levels checked more often if: ? Your lipid or cholesterol levels are high. ? You are older than 62 years of age. ? You are at high risk for heart disease. What should I know about cancer screening? Many types of cancers can be detected early and may often be prevented. Lung Cancer  You should be screened every year for lung cancer if: ? You are a current smoker who has smoked for at least 30 years. ? You are a former smoker who has quit within the past 15 years.  Talk to your health care provider about your screening options, when you should start screening, and how often you should be screened. Colorectal Cancer  Routine colorectal cancer screening usually begins at 62  years of age and should be repeated every 5-10 years until you are 62 years old. You may need to be screened more often if early forms of precancerous polyps or small growths are found. Your health care provider may recommend screening at an earlier age if you have risk factors for colon cancer.  Your health care provider may recommend using home test kits to check for hidden blood in the stool.  A small camera at the end of a tube can be used to examine your colon (sigmoidoscopy or colonoscopy). This checks for the earliest forms of colorectal cancer. Prostate and Testicular Cancer  Depending on your age and overall health, your health care provider may do certain tests to screen for prostate and testicular cancer.  Talk to your health care provider about any symptoms or concerns you have about testicular or prostate cancer. Skin Cancer  Check your skin from head to toe regularly.  Tell your health care provider about any new moles or changes in moles, especially if: ? There is a change in a mole's size, shape, or color. ? You have a mole that is larger than a pencil eraser.  Always use sunscreen. Apply sunscreen liberally and repeat throughout the day.  Protect yourself by wearing long sleeves, pants, a wide-brimmed hat, and sunglasses when outside. What should I know about heart disease, diabetes, and high blood pressure?  If you are 39-19 years of age, have your  blood pressure checked every 3-5 years. If you are 5 years of age or older, have your blood pressure checked every year. You should have your blood pressure measured twice-once when you are at a hospital or clinic, and once when you are not at a hospital or clinic. Record the average of the two measurements. To check your blood pressure when you are not at a hospital or clinic, you can use: ? An automated blood pressure machine at a pharmacy. ? A home blood pressure monitor.  Talk to your health care provider about your target  blood pressure.  If you are between 19-47 years old, ask your health care provider if you should take aspirin to prevent heart disease.  Have regular diabetes screenings by checking your fasting blood sugar level. ? If you are at a normal weight and have a low risk for diabetes, have this test once every three years after the age of 34. ? If you are overweight and have a high risk for diabetes, consider being tested at a younger age or more often.  A one-time screening for abdominal aortic aneurysm (AAA) by ultrasound is recommended for men aged 15-75 years who are current or former smokers. What should I know about preventing infection? Hepatitis B If you have a higher risk for hepatitis B, you should be screened for this virus. Talk with your health care provider to find out if you are at risk for hepatitis B infection. Hepatitis C Blood testing is recommended for:  Everyone born from [birth year] through 1964.  Anyone with known risk factors for hepatitis C. Sexually Transmitted Diseases (STDs)  You should be screened each year for STDs including gonorrhea and chlamydia if: ? You are sexually active and are younger than 62 years of age. ? You are older than 62 years of age and your health care provider tells you that you are at risk for this type of infection. ? Your sexual activity has changed since you were last screened and you are at an increased risk for chlamydia or gonorrhea. Ask your health care provider if you are at risk.  Talk with your health care provider about whether you are at high risk of being infected with HIV. Your health care provider may recommend a prescription medicine to help prevent HIV infection. What else can I do?  Schedule regular health, dental, and eye exams.  Stay current with your vaccines (immunizations).  Do not use any tobacco products, such as cigarettes, chewing tobacco, and e-cigarettes. If you need help quitting, ask your health care provider.   Limit alcohol intake to no more than 2 drinks per day. One drink equals 12 ounces of beer, 5 ounces of wine, or 1 ounces of hard liquor.  Do not use street drugs.  Do not share needles.  Ask your health care provider for help if you need support or information about quitting drugs.  Tell your health care provider if you often feel depressed.  Tell your health care provider if you have ever been abused or do not feel safe at home. This information is not intended to replace advice given to you by your health care provider. Make sure you discuss any questions you have with your health care provider. Document Released: 05/05/2008 Document Revised: 07/06/2016 Document Reviewed: 08/11/2015 Elsevier Interactive Patient Education  2019 [employer].

## 2019-02-08 NOTE — Assessment & Plan Note (Signed)
Due to stress with economy and coronavirus. Rx for short term ambien to use. Talked about risks and benefits with him.

## 2019-02-08 NOTE — Assessment & Plan Note (Signed)
Flu shot declines. Shingrix given 1st today. Tetanus up to date. Colonoscopy up to date. Counseled about sun safety and mole surveillance. Counseled about the dangers of distracted driving. Given 10 year screening recommendations.

## 2019-02-08 NOTE — Assessment & Plan Note (Signed)
Continue omeprazole 20 mg daily. Sees GI for endoscopy every 3 years.

## 2019-02-08 NOTE — Progress Notes (Signed)
   Subjective:   Patient ID: Terrance Ortiz, male    DOB: 1956-09-08, 63 y.o.   MRN: [id number]  HPI The patient is a 63 YO man coming in for physical. Lost about 20 pounds in the last 3 months and working on losing more.   PMH, [facility], social history reviewed and updated  Review of Systems  Constitutional: Negative.   HENT: Negative.   Eyes: Negative.   Respiratory: Negative for cough, chest tightness and shortness of breath.   Cardiovascular: Negative for chest pain, palpitations and leg swelling.  Gastrointestinal: Negative for abdominal distention, abdominal pain, constipation, diarrhea, nausea and vomiting.  Musculoskeletal: Negative.   Skin: Negative.   Neurological: Negative.   Psychiatric/Behavioral: Negative.     Objective:  Physical Exam Constitutional:      Appearance: He is well-developed.  HENT:     Head: Normocephalic and atraumatic.  Neck:     Musculoskeletal: Normal range of motion.  Cardiovascular:     Rate and Rhythm: Normal rate and regular rhythm.  Pulmonary:     Effort: Pulmonary effort is normal. No respiratory distress.     Breath sounds: Normal breath sounds. No wheezing or rales.  Abdominal:     General: Bowel sounds are normal. There is no distension.     Palpations: Abdomen is soft.     Tenderness: There is no abdominal tenderness. There is no rebound.  Skin:    General: Skin is warm and dry.  Neurological:     Mental Status: He is alert and oriented to person, place, and time.     Coordination: Coordination normal.     Vitals:   02/08/19 0810  BP: 102/70  Pulse: 60  Temp: 98.1 F (36.7 C)  TempSrc: Oral  SpO2: 97%  Weight: 204 lb (92.5 kg)  Height: 5\' 11"  (1.803 m)   EKG: Rate 59, axis normal, sinus, intervals normal, no st or t wave changes, no significant change from 2012.  Assessment & Plan:  Shingrix IM given at visit

## 2019-03-12 ENCOUNTER — Inpatient Hospital Stay: Admission: RE | Admit: 2019-03-12 | Payer: BLUE CROSS/BLUE SHIELD | Source: Ambulatory Visit

## 2019-03-13 ENCOUNTER — Ambulatory Visit (INDEPENDENT_AMBULATORY_CARE_PROVIDER_SITE_OTHER)
Admission: RE | Admit: 2019-03-13 | Discharge: 2019-03-13 | Disposition: A | Discharge status: Home / Self Care | Payer: BLUE CROSS/BLUE SHIELD | Source: Ambulatory Visit | Attending: Family Medicine | Admitting: Family Medicine

## 2019-03-13 ENCOUNTER — Ambulatory Visit (INDEPENDENT_AMBULATORY_CARE_PROVIDER_SITE_OTHER): Payer: BLUE CROSS/BLUE SHIELD | Admitting: Family Medicine

## 2019-03-13 ENCOUNTER — Other Ambulatory Visit: Payer: Self-pay

## 2019-03-13 ENCOUNTER — Encounter: Payer: Self-pay | Admitting: Family Medicine

## 2019-03-13 VITALS — BP 124/82 | HR 71 | Ht 71.0 in | Wt 206.0 lb

## 2019-03-13 DIAGNOSIS — M5412 Radiculopathy, cervical region: Secondary | ICD-10-CM | POA: Diagnosis not present

## 2019-03-13 DIAGNOSIS — M50322 Other cervical disc degeneration at C5-C6 level: Secondary | ICD-10-CM | POA: Diagnosis not present

## 2019-03-13 DIAGNOSIS — M50321 Other cervical disc degeneration at C4-C5 level: Secondary | ICD-10-CM | POA: Diagnosis not present

## 2019-03-13 DIAGNOSIS — M50323 Other cervical disc degeneration at C6-C7 level: Secondary | ICD-10-CM | POA: Diagnosis not present

## 2019-03-13 MED ORDER — MELOXICAM 15 MG PO TABS: 15.0000 mg | ORAL_TABLET | Freq: Every day | ORAL | 0 refills | Status: DC

## 2019-03-13 MED ORDER — METHYLPREDNISOLONE ACETATE 80 MG/ML IJ SUSP
80.0000 mg | Freq: Once | INTRAMUSCULAR | Status: AC
  Administered 2019-03-13: 15:00:00 80 mg via INTRAMUSCULAR

## 2019-03-13 MED ORDER — GABAPENTIN 100 MG PO CAPS: 200.0000 mg | ORAL_CAPSULE | Freq: Every day | ORAL | 3 refills | Status: DC

## 2019-03-13 MED ORDER — KETOROLAC TROMETHAMINE 60 MG/2ML IM SOLN
60.0000 mg | Freq: Once | INTRAMUSCULAR | Status: AC
  Administered 2019-03-13: 60 mg via INTRAMUSCULAR

## 2019-03-13 NOTE — Assessment & Plan Note (Signed)
Right-sided.  History of degenerative disc disease at C5-C6.  Radicular symptoms with Spurling's severe.  Weakness in the C8 distribution.  Patient given gabapentin, 2 injections today, patient will hold on ibuprofen over-the-counter but take meloxicam in 72 hours if no significant benefit.  Patient will call if any worsening pain.  Scapular exercises given to help with stability.  Discussed ergonomics.  Patient will follow-up with me again in 2 weeks.  Worsening symptoms advanced imaging would be warranted.  Patient is in agreement with the plan

## 2019-03-13 NOTE — Progress Notes (Signed)
[name] [address] [place], [place] [address] Phone: [phone] Subjective:   [name], am serving as a scribe for Dr. Hulan Saas.  I'm seeing this patient by the request  of:   Hoyt Koch, MD   CC: Right shoulder pain  [id number]  Richardo Popoff is a 63 y.o. male coming in with complaint of right shoulder and upper back pain for 2 weeks. Thought he may have pulled a muscle from yard work. Pain starts in right scapula and radiates down the right arm. Patient notes yesterday that he was in a 4 hour Zoom conference call. He developed pain and numbness and tingling in the right hand. Also notes weakness in the right hand. Is using 800mg  IBU daily. Pain subsides when he is in his recliner.  Patient states that his right hand does not feel right and he is right-hand dominant.  Patient seems anything seems to be agonizing.  Has not been able to sleep secondary to the severe pain.     Past Medical History:  Diagnosis Date  . Barrett's esophagus   . GERD (gastroesophageal reflux disease)   . Knee pain    recurrent intermittent tendonitis  . Obesity   . Sleep disorder   . Tubular adenoma of colon 09/2011  . Varicella    Past Surgical History:  Procedure Laterality Date  . APPENDECTOMY    . COLONOSCOPY    . fracture collar bone     no surgery  . LAPAROSCOPIC APPENDECTOMY N/A 01/17/2015   Procedure: APPENDECTOMY LAPAROSCOPIC;  Surgeon: Doreen Salvage, MD;  Location: [facility];  Service: General;  Laterality: N/A;  . TONSILLECTOMY     1975  . UPPER GASTROINTESTINAL ENDOSCOPY    . VASECTOMY  1995  . WISDOM TOOTH EXTRACTION     Social History   Socioeconomic History  . Marital status: Married    Spouse name: Not on file  . Number of children: 2  . Years of education: 31  . Highest education level: Not on file  Occupational History  . Occupation: [employer]  . Financial resource strain: Not on file  . Food  insecurity:    Worry: Not on file    Inability: Not on file  . Transportation needs:    Medical: Not on file    Non-medical: Not on file  Tobacco Use  . Smoking status: Former Smoker    Packs/day: 1.00    Years: 30.00    Pack years: 30.00    Last attempt to quit: 01/11/2011    Years since quitting: 8.1  . Smokeless tobacco: Never Used  Substance and Sexual Activity  . Alcohol use: Yes    Alcohol/week: 14.0 standard drinks    Types: 14 Glasses of wine per week    Comment:  5 days a week wine,beer  . Drug use: No  . Sexual activity: Yes    Partners: Female  Lifestyle  . Physical activity:    Days per week: Not on file    Minutes per session: Not on file  . Stress: Not on file  Relationships  . Social connections:    Talks on phone: Not on file    Gets together: Not on file    Attends religious service: Not on file    Active member of club or organization: Not on file    Attends meetings of clubs or organizations: Not on file    Relationship status:  Not on file  Other Topics Concern  . 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             Do you drink Alcoholic Beverages-yes. Do you drink caffienated Beverages-no. Do you use seatbelt often-yes. Do you exercise at least 3 times a week-no. Is there a smoke alarm in your house-yes. Have you experienced physical abuse-no         Usual # of hours of sleep per night 6-7                        No Known Allergies Family History  Problem Relation Age of Onset  . Cancer Father        esophageal cancer-had mets but survived 85 yrs  . Esophageal cancer Father   . Diabetes Neg Hx   . Hypertension Neg Hx   . Hyperlipidemia Neg Hx   . Heart disease Neg Hx   . Colon cancer Neg Hx   . Rectal cancer Neg Hx   . Stomach cancer Neg Hx           Current Outpatient Medications (Other):  .  omeprazole (PRILOSEC) 20 MG capsule, TAKE 1 CAPSULE (20 MG TOTAL) BY MOUTH DAILY. Marland Kitchen  zolpidem (AMBIEN CR) 12.5 MG CR tablet, Take 1 tablet (12.5 mg total) by mouth at bedtime as needed for sleep.    Past medical history, social, surgical and family history all reviewed in electronic medical record.  No pertanent information unless stated regarding to the chief complaint.   Review of Systems:  No headache, visual changes, nausea, vomiting, diarrhea, constipation, dizziness, abdominal pain, skin rash, fevers, chills, night sweats, weight loss, swollen lymph nodes, body aches, joint swelling,  chest pain, shortness of breath, mood changes.  Positive muscle aches  Objective    General: No apparent distress alert and oriented x3 mood and affect normal, dressed appropriately.  HEENT: Pupils equal, extraocular movements intact  Respiratory: Patient's speak in full sentences and does not appear short of breath  Cardiovascular: No lower extremity edema, non tender, no erythema  Skin: Warm dry intact with no signs of infection or rash on extremities or on axial skeleton.  Abdomen: Soft nontender  Neuro: Cranial nerves II through XII are intact, neurovascularly intact in all extremities with 2+ DTRs and 2+ pulses.  Lymph: No lymphadenopathy of posterior or anterior cervical chain or axillae bilaterally.  Gait normal with good balance and coordination.  MSK:  Non tender with full range of motion and good stability and symmetric strength and tone of shoulders, elbows, wrist, hip, knee and ankles bilaterally.  Neck: Inspection loss of lordosis No palpable stepoffs. Positive Spurling's maneuver with radicular symptoms right side C8 distribution. Patient is guarding and unable to do full range of motion testing. Decreased strength in the C8 distribution on the right side Negative Hoffman sign bilaterally Reflexes normal  97110; 15 additional minutes spent  for Therapeutic exercises as stated in above notes.  This included exercises focusing on stretching, strengthening, with significant focus on eccentric aspects.   Long term goals include an improvement in range of motion, strength, endurance as well as avoiding reinjury. Patient's frequency would include in 1-2 times a day, 3-5 times a week for a duration of 6-12  weeks.Exercises that included:  Basic scapular stabilization to include adduction and depression of scapula Scaption, focusing on proper movement and good control Internal and External rotation utilizing a theraband, with elbow tucked at side entire time Rows with theraband   Proper technique shown and discussed handout in great detail with ATC.  All questions were discussed and answered.     Impression and Recommendations:     This case required medical decision making of moderate complexity. The above documentation has been reviewed and is accurate and complete Lyndal Pulley, DO       Note: This dictation was prepared with Dragon dictation along with smaller phrase technology. Any transcriptional errors that result from this process are unintentional.

## 2019-03-13 NOTE — Patient Instructions (Addendum)
Very nice to meet you  Ice 20 minutes 2 times daily. Usually after activity and before bed. Exercises 3 times a week.  Xray downstairs  2 injections today  No more ibuprofen  Starting tonight take gabapentin 200mg  at night If not better in 3 days start the meloxicam and take it daily for 10 days  See me again in 2 weeks

## 2019-03-27 ENCOUNTER — Other Ambulatory Visit: Payer: Self-pay

## 2019-03-27 ENCOUNTER — Ambulatory Visit (INDEPENDENT_AMBULATORY_CARE_PROVIDER_SITE_OTHER): Payer: BLUE CROSS/BLUE SHIELD | Admitting: Family Medicine

## 2019-03-27 ENCOUNTER — Encounter: Payer: Self-pay | Admitting: Family Medicine

## 2019-03-27 DIAGNOSIS — M5412 Radiculopathy, cervical region: Secondary | ICD-10-CM | POA: Diagnosis not present

## 2019-03-27 MED ORDER — GABAPENTIN 100 MG PO CAPS: 100.0000 mg | ORAL_CAPSULE | Freq: Two times a day (BID) | ORAL | 3 refills | Status: DC

## 2019-03-27 MED ORDER — GABAPENTIN 300 MG PO CAPS: ORAL_CAPSULE | ORAL | 3 refills | Status: DC

## 2019-03-27 NOTE — Patient Instructions (Signed)
Am proud of you  It seems to be improving.  Increase gabapentin to 300mg  at night After a week then decide if you need 100mg  in AM and PM  Keep doing the exercises  See me again in 4ish weeks either live or video.  Be safe

## 2019-03-27 NOTE — Progress Notes (Signed)
[facility] [name] [place], [name] [address] Phone: [phone] Subjective:   [name], am serving as a scribe for Dr. Hulan Saas.   CC: Neck pain follow-up  [id number]   03/13/2019: Right-sided.  History of degenerative disc disease at C5-C6.  Radicular symptoms with Spurling's severe.  Weakness in the C8 distribution.  Patient given gabapentin, 2 injections today, patient will hold on ibuprofen over-the-counter but take meloxicam in 72 hours if no significant benefit.  Patient will call if any worsening pain.  Scapular exercises given to help with stability.  Discussed ergonomics.  Patient will follow-up with me again in 2 weeks.  Worsening symptoms advanced imaging would be warranted.  Patient is in agreement with the plan  Update 03/27/2019: Terrance Ortiz is a 63 y.o. male coming in with complaint of neck pain. Patient states that he still has constant pain but that his pain intensity has improved. Pain increases with driving, working at a computer and shaving ie. Rotational movements.  Patient was given gabapentin has noticed that the pain is significantly patient has noticeably decreased strength and has been better.  Really not fatiguing as much throughout the day.  No significant change in sleep    Past Medical History:  Diagnosis Date  . Barrett's esophagus   . GERD (gastroesophageal reflux disease)   . Knee pain    recurrent intermittent tendonitis  . Obesity   . Sleep disorder   . Tubular adenoma of colon 09/2011  . Varicella    Past Surgical History:  Procedure Laterality Date  . APPENDECTOMY    . COLONOSCOPY    . fracture collar bone     no surgery  . LAPAROSCOPIC APPENDECTOMY N/A 01/17/2015   Procedure: APPENDECTOMY LAPAROSCOPIC;  Surgeon: Doreen Salvage, MD;  Location: [facility];  Service: General;  Laterality: N/A;  . TONSILLECTOMY     1975  . UPPER GASTROINTESTINAL ENDOSCOPY    . VASECTOMY  1995  . WISDOM TOOTH  EXTRACTION     Social History   Socioeconomic History  . Marital status: Married    Spouse name: Not on file  . Number of children: 2  . Years of education: 24  . Highest education level: Not on file  Occupational History  . Occupation: [employer]  . Financial resource strain: Not on file  . Food insecurity:    Worry: Not on file    Inability: Not on file  . Transportation needs:    Medical: Not on file    Non-medical: Not on file  Tobacco Use  . Smoking status: Former Smoker    Packs/day: 1.00    Years: 30.00    Pack years: 30.00    Last attempt to quit: 01/11/2011    Years since quitting: 8.2  . Smokeless tobacco: Never Used  Substance and Sexual Activity  . Alcohol use: Yes    Alcohol/week: 14.0 standard drinks    Types: 14 Glasses of wine per week    Comment:  5 days a week wine,beer  . Drug use: No  . Sexual activity: Yes    Partners: Female  Lifestyle  . Physical activity:    Days per week: Not on file    Minutes per session: Not on file  . Stress: Not on file  Relationships  . Social connections:    Talks on phone: Not on file    Gets together: Not on file    Attends religious  service: Not on file    Active member of club or organization: Not on file    Attends meetings of clubs or organizations: Not on file    Relationship status: Not on file  Other Topics Concern  . 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             Do you drink Alcoholic Beverages-yes. Do you drink caffienated Beverages-no. Do you use seatbelt often-yes. Do you exercise at least 3 times a week-no. Is there a smoke alarm in your house-yes. Have you experienced physical abuse-no         Usual # of hours of sleep per night 6-7                        No Known Allergies Family History   Problem Relation Age of Onset  . Cancer Father        esophageal cancer-had mets but survived 46 yrs  . Esophageal cancer Father   . Diabetes Neg Hx   . Hypertension Neg Hx   . Hyperlipidemia Neg Hx   . Heart disease Neg Hx   . Colon cancer Neg Hx   . Rectal cancer Neg Hx   . Stomach cancer Neg Hx        Current Outpatient Medications (Analgesics):  .  meloxicam (MOBIC) 15 MG tablet, Take 1 tablet (15 mg total) by mouth daily.   Current Outpatient Medications (Other):  .  omeprazole (PRILOSEC) 20 MG capsule, TAKE 1 CAPSULE (20 MG TOTAL) BY MOUTH DAILY. Marland Kitchen  zolpidem (AMBIEN CR) 12.5 MG CR tablet, Take 1 tablet (12.5 mg total) by mouth at bedtime as needed for sleep. Marland Kitchen  gabapentin (NEURONTIN) 100 MG capsule, Take 1 capsule (100 mg total) by mouth 2 (two) times daily. Marland Kitchen  gabapentin (NEURONTIN) 300 MG capsule, nightly    Past medical history, social, surgical and family history all reviewed in electronic medical record.  No pertanent information unless stated regarding to the chief complaint.   Review of Systems:  No headache, visual changes, nausea, vomiting, diarrhea, constipation, dizziness, abdominal pain, skin rash, fevers, chills, night sweats, weight loss, swollen lymph nodes, body aches, joint swelling,  chest pain, shortness of breath, mood changes.  Positive muscle aches  Objective  Blood pressure 122/78, pulse 70, height 5\' 11"  (1.803 m), weight 199 lb (90.3 kg), SpO2 98 %.    General: No apparent distress alert and oriented x3 mood and affect normal, dressed appropriately.  HEENT: Pupils equal, extraocular movements intact  Respiratory: Patient's speak in full sentences and does not appear short of breath  Cardiovascular: No lower extremity edema, non tender, no erythema  Skin: Warm dry intact with no signs of infection or rash on extremities or on axial skeleton.  Abdomen: Soft nontender  Neuro: Cranial nerves II through XII are intact, neurovascularly intact in  all extremities with 2+ DTRs and 2+ pulses.  Lymph: No lymphadenopathy of posterior or anterior cervical chain or axillae bilaterally.  Gait normal with good balance and coordination.  MSK:  Non tender with full range of motion and good stability and symmetric strength and tone of shoulders, elbows, wrist, hip, knee and ankles bilaterally.  Neck exam shows significant loss of lordosis.  Still has a  planes 15 degrees of extension as well as 5 degrees of sidebending and rotation bilaterally.  Crepitus noted.  Significant pain with Spurling's but no true radicular symptoms.  Grip strength is improved on the right side.  5 strength no C6 distribution compared to the contralateral side.    Impression and Recommendations:     . The above documentation has been reviewed and is accurate and complete Lyndal Pulley, DO       Note: This dictation was prepared with Dragon dictation along with smaller phrase technology. Any transcriptional errors that result from this process are unintentional.

## 2019-03-27 NOTE — Assessment & Plan Note (Addendum)
Patient is improving.  Does have the radicular symptoms and still has some mild weakness.  Discussed with patient about posture and ergonomics.  Increase gabapentin to 300 mg at night with the titration of 100 mg during the day if necessary.  Patient will be activity slowly.  Follow-up again in 4 weeks

## 2019-04-11 ENCOUNTER — Ambulatory Visit: Payer: BLUE CROSS/BLUE SHIELD

## 2019-04-19 ENCOUNTER — Other Ambulatory Visit: Payer: Self-pay | Admitting: Acute Care

## 2019-04-19 DIAGNOSIS — Z87891 Personal history of nicotine dependence: Secondary | ICD-10-CM

## 2019-04-19 DIAGNOSIS — Z122 Encounter for screening for malignant neoplasm of respiratory organs: Secondary | ICD-10-CM

## 2019-04-20 ENCOUNTER — Other Ambulatory Visit: Payer: Self-pay | Admitting: Family Medicine

## 2019-04-21 NOTE — Progress Notes (Signed)
[facility] [address] [place], [place] [address] Phone: [phone] Subjective:   I Terrance Ortiz am serving as a Education administrator for Dr. Hulan Saas.   CC: neck pain follow up   MOQ:[id number]  Terrance Ortiz is a 63 y.o. male coming in with complaint of neck pain. States that he feels about the same. Patient has had this pain for quite some time.  Describes the pain as a dull, throbbing aching sensation.  Has seen me multiple times.  Does not think he has made significant provement from the previous one.  Does feel the higher dose of the gabapentin has helped him at night patient states once concerning more than anything else is now having more of a constant numbness in the thumb and index finger on the right side of his body.      Past Medical History:  Diagnosis Date  . Barrett's esophagus   . GERD (gastroesophageal reflux disease)   . Knee pain    recurrent intermittent tendonitis  . Obesity   . Sleep disorder   . Tubular adenoma of colon 09/2011  . Varicella    Past Surgical History:  Procedure Laterality Date  . APPENDECTOMY    . COLONOSCOPY    . fracture collar bone     no surgery  . LAPAROSCOPIC APPENDECTOMY N/A 01/17/2015   Procedure: APPENDECTOMY LAPAROSCOPIC;  Surgeon: Doreen Salvage, MD;  Location: [facility];  Service: General;  Laterality: N/A;  . TONSILLECTOMY     1975  . UPPER GASTROINTESTINAL ENDOSCOPY    . VASECTOMY  1995  . WISDOM TOOTH EXTRACTION     Social History   Socioeconomic History  . Marital status: Married    Spouse name: Not on file  . Number of children: 2  . Years of education: 61  . Highest education level: Not on file  Occupational History  . Occupation: [employer]  . Financial resource strain: Not on file  . Food insecurity:    Worry: Not on file    Inability: Not on file  . Transportation needs:    Medical: Not on file    Non-medical: Not on file  Tobacco Use  . Smoking status: Former  Smoker    Packs/day: 1.00    Years: 30.00    Pack years: 30.00    Last attempt to quit: 01/11/2011    Years since quitting: 8.2  . Smokeless tobacco: Never Used  Substance and Sexual Activity  . Alcohol use: Yes    Alcohol/week: 14.0 standard drinks    Types: 14 Glasses of wine per week    Comment:  5 days a week wine,beer  . Drug use: No  . Sexual activity: Yes    Partners: Female  Lifestyle  . Physical activity:    Days per week: Not on file    Minutes per session: Not on file  . Stress: Not on file  Relationships  . Social connections:    Talks on phone: Not on file    Gets together: Not on file    Attends religious service: Not on file    Active member of club or organization: Not on file    Attends meetings of clubs or organizations: Not on file    Relationship status: Not on file  Other Topics Concern  . 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 1  dtr - '94, 1 son '93. 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             Do you drink Alcoholic Beverages-yes. Do you drink caffienated Beverages-no. Do you use seatbelt often-yes. Do you exercise at least 3 times a week-no. Is there a smoke alarm in your house-yes. Have you experienced physical abuse-no         Usual # of hours of sleep per night 6-7                        No Known Allergies Family History  Problem Relation Age of Onset  . Cancer Father        esophageal cancer-had mets but survived 31 yrs  . Esophageal cancer Father   . Diabetes Neg Hx   . Hypertension Neg Hx   . Hyperlipidemia Neg Hx   . Heart disease Neg Hx   . Colon cancer Neg Hx   . Rectal cancer Neg Hx   . Stomach cancer Neg Hx        Current Outpatient Medications (Analgesics):  .  meloxicam (MOBIC) 15 MG tablet, Take 1 tablet (15 mg total) by mouth daily.   Current Outpatient Medications (Other):  .  gabapentin  (NEURONTIN) 100 MG capsule, Take 1 capsule (100 mg total) by mouth 2 (two) times daily. Marland Kitchen  gabapentin (NEURONTIN) 300 MG capsule, TAKE 1 CAPSULE BY MOUTH NIGHTLY .  omeprazole (PRILOSEC) 20 MG capsule, TAKE 1 CAPSULE (20 MG TOTAL) BY MOUTH DAILY. Marland Kitchen  zolpidem (AMBIEN CR) 12.5 MG CR tablet, Take 1 tablet (12.5 mg total) by mouth at bedtime as needed for sleep.    Past medical history, social, surgical and family history all reviewed in electronic medical record.  No pertanent information unless stated regarding to the chief complaint.   Review of Systems:  No headache, visual changes, nausea, vomiting, diarrhea, constipation, dizziness, abdominal pain, skin rash, fevers, chills, night sweats, weight loss, swollen lymph nodes, body aches, joint swelling, muscle aches, chest pain, shortness of breath, mood changes.   Objective  Blood pressure 136/74, pulse (!) 57, height 5\' 11"  (1.803 m), weight 199 lb (90.3 kg), SpO2 94 %.    General: No apparent distress alert and oriented x3 mood and affect normal, dressed appropriately.  HEENT: Pupils equal, extraocular movements intact  Respiratory: Patient's speak in full sentences and does not appear short of breath  Cardiovascular: No lower extremity edema, non tender, no erythema  Skin: Warm dry intact with no signs of infection or rash on extremities or on axial skeleton.  Abdomen: Soft nontender  Neuro: Cranial nerves II through XII are intact, neurovascularly intact in all extremities with 2+ DTRs and 2+ pulses.  Lymph: No lymphadenopathy of posterior or anterior cervical chain or axillae bilaterally.  Gait normal with good balance and coordination.  MSK:  Non tender with full range of motion and good stability and symmetric strength and tone of shoulders, elbows, wrist, hip, knee and ankles bilaterally.  Neck: Inspection loss of lordosis. No palpable stepoffs. Mild positive Spurling's maneuver. Decreasing range of motion lacking last 10 degrees  of sidebending bilaterally Grip strength and sensation normal in bilateral hands Strength good C4 to T1 distribution No sensory change to C4 to T1 Negative Hoffman sign bilaterally Reflexes normal As of the right trapezius Patient's wrist exam does have a mild positive Tinel's. Osteopathic findings C2 flexed rotated and side bent right  C4 flexed rotated and side bent left C6 flexed rotated and side bent left T3 extended rotated and side bent right inhaled third rib     Impression and Recommendations:     This case required medical decision making of moderate complexity. The above documentation has been reviewed and is accurate and complete Lyndal Pulley, DO       Note: This dictation was prepared with Dragon dictation along with smaller phrase technology. Any transcriptional errors that result from this process are unintentional.

## 2019-04-22 ENCOUNTER — Ambulatory Visit (INDEPENDENT_AMBULATORY_CARE_PROVIDER_SITE_OTHER): Payer: BLUE CROSS/BLUE SHIELD | Admitting: Family Medicine

## 2019-04-22 ENCOUNTER — Encounter: Payer: Self-pay | Admitting: Family Medicine

## 2019-04-22 ENCOUNTER — Other Ambulatory Visit: Payer: Self-pay

## 2019-04-22 VITALS — BP 136/74 | HR 57 | Ht 71.0 in | Wt 199.0 lb

## 2019-04-22 DIAGNOSIS — Z23 Encounter for immunization: Secondary | ICD-10-CM | POA: Diagnosis not present

## 2019-04-22 DIAGNOSIS — M999 Biomechanical lesion, unspecified: Secondary | ICD-10-CM | POA: Diagnosis not present

## 2019-04-22 DIAGNOSIS — M5412 Radiculopathy, cervical region: Secondary | ICD-10-CM | POA: Diagnosis not present

## 2019-04-22 DIAGNOSIS — M542 Cervicalgia: Secondary | ICD-10-CM

## 2019-04-22 NOTE — Patient Instructions (Signed)
Good to see you  They will call you on the nerve conductoin test  Tried manipulation and hope it helps Continue everything else See me again in 3 weeks

## 2019-04-22 NOTE — Assessment & Plan Note (Signed)
Continues to have some problems.  Patient continues to have some radicular symptoms.  Patient advised not want any type of epidural at the moment and would like to avoid any type of surgical intervention so is declining an MRI.  Patient would consider a nerve conduction study with some of the increasing in the numbness of the hand.  We will order this.  Attempted osteopathic manipulation today as well.  Hopefully this will improve.  Patient will increase activity follow-up again in 4 to 6 weeks

## 2019-04-22 NOTE — Assessment & Plan Note (Signed)
Decision today to treat with OMT was based on Physical Exam  After verbal consent patient was treated with HVLA, ME, FPR techniques in cervical, thoracic, rib areas  Patient tolerated the procedure well with improvement in symptoms  Patient given exercises, stretches and lifestyle modifications  See medications in patient instructions if given  Patient will follow up in 4-6 weeks 

## 2019-05-14 ENCOUNTER — Encounter: Payer: Self-pay | Admitting: Family Medicine

## 2019-05-14 ENCOUNTER — Ambulatory Visit (INDEPENDENT_AMBULATORY_CARE_PROVIDER_SITE_OTHER): Payer: BC Managed Care – PPO | Admitting: Family Medicine

## 2019-05-14 ENCOUNTER — Other Ambulatory Visit: Payer: Self-pay

## 2019-05-14 VITALS — BP 120/70 | HR 71 | Ht 71.0 in | Wt 200.0 lb

## 2019-05-14 DIAGNOSIS — M5412 Radiculopathy, cervical region: Secondary | ICD-10-CM

## 2019-05-14 DIAGNOSIS — M999 Biomechanical lesion, unspecified: Secondary | ICD-10-CM

## 2019-05-14 NOTE — Assessment & Plan Note (Signed)
Decision today to treat with OMT was based on Physical Exam  After verbal consent patient was treated with HVLA, ME, FPR techniques in cervical, thoracic, rib  areas  Patient tolerated the procedure well with improvement in symptoms  Patient given exercises, stretches and lifestyle modifications  See medications in patient instructions if given  Patient will follow up in 4-5 weeks

## 2019-05-14 NOTE — Assessment & Plan Note (Signed)
No radicular symptoms.  We discussed posture and ergonomics.  Patient has a nerve conduction test on the second.  We will see what that shows.  Responding well to osteopathic manipulation.  Follow-up again in 4 weeks

## 2019-05-14 NOTE — Progress Notes (Signed)
[name] [place], [place] [address] Phone: [phone] Subjective:   I [name] am serving as a Education administrator for Dr. Hulan Saas.   CC: Neck pain follow-up  [id number]  Terrance Ortiz is a 63 y.o. male coming in with complaint of neck pain. States that he feels about the same. He doesn't have any pain just tingling in the right arm since 4am.  Patient still has intermittent radicular symptoms going down the arm.  Continue with the gabapentin at night today.  Takes the meloxicam intermittently.     Past Medical History:  Diagnosis Date  . Barrett's esophagus   . GERD (gastroesophageal reflux disease)   . Knee pain    recurrent intermittent tendonitis  . Obesity   . Sleep disorder   . Tubular adenoma of colon 09/2011  . Varicella    Past Surgical History:  Procedure Laterality Date  . APPENDECTOMY    . COLONOSCOPY    . fracture collar bone     no surgery  . LAPAROSCOPIC APPENDECTOMY N/A 01/17/2015   Procedure: APPENDECTOMY LAPAROSCOPIC;  Surgeon: Doreen Salvage, MD;  Location: [facility];  Service: General;  Laterality: N/A;  . TONSILLECTOMY     1975  . UPPER GASTROINTESTINAL ENDOSCOPY    . VASECTOMY  1995  . WISDOM TOOTH EXTRACTION     Social History   Socioeconomic History  . Marital status: Married    Spouse name: Not on file  . Number of children: 2  . Years of education: 48  . Highest education level: Not on file  Occupational History  . Occupation: [employer]  . Financial resource strain: Not on file  . Food insecurity    Worry: Not on file    Inability: Not on file  . Transportation needs    Medical: Not on file    Non-medical: Not on file  Tobacco Use  . Smoking status: Former Smoker    Packs/day: 1.00    Years: 30.00    Pack years: 30.00    Quit date: 01/11/2011    Years since quitting: 8.3  . Smokeless tobacco: Never Used  Substance and Sexual Activity  . Alcohol use: Yes   Alcohol/week: 14.0 standard drinks    Types: 14 Glasses of wine per week    Comment:  5 days a week wine,beer  . Drug use: No  . Sexual activity: Yes    Partners: Female  Lifestyle  . Physical activity    Days per week: Not on file    Minutes per session: Not on file  . Stress: Not on file  Relationships  . Social Herbalist on phone: Not on file    Gets together: Not on file    Attends religious service: Not on file    Active member of club or organization: Not on file    Attends meetings of clubs or organizations: Not on file    Relationship status: Not on file  Other Topics Concern  . 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             Do you drink Alcoholic Beverages-yes. Do you drink caffienated Beverages-no. Do you use seatbelt often-yes. Do you exercise at least  3 times a week-no. Is there a smoke alarm in your house-yes. Have you experienced physical abuse-no         Usual # of hours of sleep per night 6-7                        No Known Allergies Family History  Problem Relation Age of Onset  . Cancer Father        esophageal cancer-had mets but survived 73 yrs  . Esophageal cancer Father   . Diabetes Neg Hx   . Hypertension Neg Hx   . Hyperlipidemia Neg Hx   . Heart disease Neg Hx   . Colon cancer Neg Hx   . Rectal cancer Neg Hx   . Stomach cancer Neg Hx        Current Outpatient Medications (Analgesics):  .  meloxicam (MOBIC) 15 MG tablet, Take 1 tablet (15 mg total) by mouth daily.   Current Outpatient Medications (Other):  .  gabapentin (NEURONTIN) 100 MG capsule, Take 1 capsule (100 mg total) by mouth 2 (two) times daily. Marland Kitchen  gabapentin (NEURONTIN) 300 MG capsule, TAKE 1 CAPSULE BY MOUTH NIGHTLY .  omeprazole (PRILOSEC) 20 MG capsule, TAKE 1 CAPSULE (20 MG  TOTAL) BY MOUTH DAILY. Marland Kitchen  zolpidem (AMBIEN CR) 12.5 MG CR tablet, Take 1 tablet (12.5 mg total) by mouth at bedtime as needed for sleep.    Past medical history, social, surgical and family history all reviewed in electronic medical record.  No pertanent information unless stated regarding to the chief complaint.   Review of Systems:  No headache, visual changes, nausea, vomiting, diarrhea, constipation, dizziness, abdominal pain, skin rash, fevers, chills, night sweats, weight loss, swollen lymph nodes, body aches, joint swelling,  chest pain, shortness of breath, mood changes.  Positive muscle aches  Objective  Blood pressure 120/70, pulse 71, height 5\' 11"  (1.803 m), weight 200 lb (90.7 kg), SpO2 98 %.    General: No apparent distress alert and oriented x3 mood and affect normal, dressed appropriately.  HEENT: Pupils equal, extraocular movements intact  Respiratory: Patient's speak in full sentences and does not appear short of breath  Cardiovascular: No lower extremity edema, non tender, no erythema  Skin: Warm dry intact with no signs of infection or rash on extremities or on axial skeleton.  Abdomen: Soft nontender  Neuro: Cranial nerves II through XII are intact, neurovascularly intact in all extremities with 2+ DTRs and 2+ pulses.  Lymph: No lymphadenopathy of posterior or anterior cervical chain or axillae bilaterally.  Gait normal with good balance and coordination.  MSK:  Non tender with full range of motion and good stability and symmetric strength and tone of shoulders, elbows, wrist, hip, knee and ankles bilaterally.  Mild arthritic changes of multiple joints Neck: Inspection unremarkable. No palpable stepoffs. Positive Spurling's maneuver right-sided C8 distribution Patient does have some mild limited range of motion in all planes Grip strength and sensation normal in bilateral hands Strength good C4 to T1 distribution this is improved from previous exam No sensory  change to C4 to T1 Negative Hoffman sign bilaterally Reflexes normal Tightness of the right trapezius  Osteopathic findings C2 flexed rotated and side bent right C4 flexed rotated and side bent left C6 flexed rotated and side bent left T3 extended rotated and side bent right inhaled third rib    Impression and Recommendations:     This case required medical decision making of moderate complexity. The above  documentation has been reviewed and is accurate and complete Lyndal Pulley, DO       Note: This dictation was prepared with Dragon dictation along with smaller phrase technology. Any transcriptional errors that result from this process are unintentional.

## 2019-05-14 NOTE — Patient Instructions (Signed)
Overall looking good Lets see what the never conduction study says Keep it up! See me again in 4 weeks

## 2019-05-21 ENCOUNTER — Other Ambulatory Visit: Payer: Self-pay

## 2019-05-21 ENCOUNTER — Ambulatory Visit: Payer: Self-pay

## 2019-05-21 DIAGNOSIS — M542 Cervicalgia: Secondary | ICD-10-CM

## 2019-05-21 NOTE — Telephone Encounter (Signed)
Pt calling with exposure to son who was exposed by his roommate with Covid 53. Pt had dinner with son at pt's home and was in contact with son for 3 hours. Pt denies any symptoms. Care advice given and pt verbalized understanding. Note forwarded to office.  Reason for Disposition . [1] COVID-19 EXPOSURE (Close Contact) AND [2] within last 14 days BUT [3] NO symptoms  Answer Assessment - Initial Assessment Questions 1. CLOSE CONTACT: "Who is the person with the confirmed or suspected COVID-19 infection that you were exposed to?"     son 2. PLACE of CONTACT: "Where were you when you were exposed to COVID-19?" (e.g., home, school, medical waiting room; which city?)     Son came to dinner 3. TYPE of CONTACT: "How much contact was there?" (e.g., sitting next to, live in same house, work in same office, same building)     Sitting next to 4. DURATION of CONTACT: "How long were you in contact with the COVID-19 patient?" (e.g., a few seconds, passed by person, a few minutes, live with the patient)     3 hours 5. DATE of CONTACT: "When did you have contact with a COVID-19 patient?" (e.g., how many days ago)     05/19/19 6. TRAVEL: "Have you traveled out of the country recently?" If so, "When and where?"     * Also ask about out-of-state travel, since the CDC has identified some high-risk cities for community spread in the [place].     * Note: Travel becomes less relevant if there is widespread community transmission where the patient lives.    [facility] 05/08/19-05/09/19 7. COMMUNITY SPREAD: "Are there lots of cases of COVID-19 (community spread) where you live?" (See public health department website, if unsure)       yes 8. SYMPTOMS: "Do you have any symptoms?" (e.g., fever, cough, breathing difficulty)     no 9. PREGNANCY OR POSTPARTUM: "Is there any chance you are pregnant?" "When was your last menstrual period?" "Did you deliver in the last 2 weeks?"     n/a 10. HIGH RISK: "Do you have any heart or  lung problems? Do you have a weak immune system?" (e.g., CHF, COPD, asthma, HIV positive, chemotherapy, renal failure, diabetes mellitus, sickle cell anemia)       No-no  Protocols used: CORONAVIRUS (COVID-19) EXPOSURE-A-AH

## 2019-05-21 NOTE — Progress Notes (Signed)
em 

## 2019-05-21 NOTE — Telephone Encounter (Signed)
Please advise if appt Is necessary

## 2019-05-22 NOTE — Telephone Encounter (Signed)
Patient informed of MD response and stated understanding  

## 2019-05-22 NOTE — Telephone Encounter (Signed)
Not direct exposure so without symptoms he is not a candidate for testing.

## 2019-05-23 ENCOUNTER — Encounter: Payer: BLUE CROSS/BLUE SHIELD | Admitting: Neurology

## 2019-05-31 ENCOUNTER — Ambulatory Visit (INDEPENDENT_AMBULATORY_CARE_PROVIDER_SITE_OTHER)
Admission: RE | Admit: 2019-05-31 | Discharge: 2019-05-31 | Disposition: A | Discharge status: Home / Self Care | Payer: BC Managed Care – PPO | Source: Ambulatory Visit | Attending: Acute Care | Admitting: Acute Care

## 2019-05-31 ENCOUNTER — Other Ambulatory Visit: Payer: Self-pay

## 2019-05-31 DIAGNOSIS — Z87891 Personal history of nicotine dependence: Secondary | ICD-10-CM

## 2019-05-31 DIAGNOSIS — Z122 Encounter for screening for malignant neoplasm of respiratory organs: Secondary | ICD-10-CM

## 2019-06-03 ENCOUNTER — Other Ambulatory Visit: Payer: Self-pay | Admitting: *Deleted

## 2019-06-03 DIAGNOSIS — Z122 Encounter for screening for malignant neoplasm of respiratory organs: Secondary | ICD-10-CM

## 2019-06-03 DIAGNOSIS — Z87891 Personal history of nicotine dependence: Secondary | ICD-10-CM

## 2019-06-11 ENCOUNTER — Encounter: Payer: Self-pay | Admitting: Family Medicine

## 2019-06-11 ENCOUNTER — Other Ambulatory Visit: Payer: Self-pay

## 2019-06-11 ENCOUNTER — Ambulatory Visit (INDEPENDENT_AMBULATORY_CARE_PROVIDER_SITE_OTHER): Payer: BC Managed Care – PPO | Admitting: Family Medicine

## 2019-06-11 VITALS — BP 130/70 | HR 64 | Ht 71.0 in | Wt 199.0 lb

## 2019-06-11 DIAGNOSIS — M5412 Radiculopathy, cervical region: Secondary | ICD-10-CM | POA: Diagnosis not present

## 2019-06-11 DIAGNOSIS — M999 Biomechanical lesion, unspecified: Secondary | ICD-10-CM

## 2019-06-11 NOTE — Patient Instructions (Signed)
Good to see you Keep up exercises No changes in medication 5-6 weeks

## 2019-06-11 NOTE — Assessment & Plan Note (Signed)
Decision today to treat with OMT was based on Physical Exam  After verbal consent patient was treated with HVLA, ME, FPR techniques in cervical, thoracic rib areas  Patient tolerated the procedure well with improvement in symptoms  Patient given exercises, stretches and lifestyle modifications  See medications in patient instructions if given  Patient will follow up in 5-6 weeks

## 2019-06-11 NOTE — Progress Notes (Signed)
[name] [address], [place] [address] Phone: [phone] Subjective:   I [name] am serving as a Education administrator for Dr. Hulan Saas.   CC: Neck pain follow-up  [id number]  Terrance Ortiz is a 63 y.o. male coming in with complaint of neck pain. Last seen on 05/14/2019. Patient has successfully be treated using OMT. Patient states that his neck seems to be improved.  Patient feels like he is making some progress.  Is scheduled for a nerve conduction test this Thursday.  Thinks he would like to continue to do it just to get manipulation.    Degenerative changes of the neck on x-ray from April 2020.  Past Medical History:  Diagnosis Date  . Barrett's esophagus   . GERD (gastroesophageal reflux disease)   . Knee pain    recurrent intermittent tendonitis  . Obesity   . Sleep disorder   . Tubular adenoma of colon 09/2011  . Varicella    Past Surgical History:  Procedure Laterality Date  . APPENDECTOMY    . COLONOSCOPY    . fracture collar bone     no surgery  . LAPAROSCOPIC APPENDECTOMY N/A 01/17/2015   Procedure: APPENDECTOMY LAPAROSCOPIC;  Surgeon: Doreen Salvage, MD;  Location: [facility];  Service: General;  Laterality: N/A;  . TONSILLECTOMY     1975  . UPPER GASTROINTESTINAL ENDOSCOPY    . VASECTOMY  1995  . WISDOM TOOTH EXTRACTION     Social History   Socioeconomic History  . Marital status: Married    Spouse name: Not on file  . Number of children: 2  . Years of education: 45  . Highest education level: Not on file  Occupational History  . Occupation: [employer]  . Financial resource strain: Not on file  . Food insecurity    Worry: Not on file    Inability: Not on file  . Transportation needs    Medical: Not on file    Non-medical: Not on file  Tobacco Use  . Smoking status: Former Smoker    Packs/day: 1.00    Years: 30.00    Pack years: 30.00    Quit date: 01/11/2011    Years since quitting: 8.4  .  Smokeless tobacco: Never Used  Substance and Sexual Activity  . Alcohol use: Yes    Alcohol/week: 14.0 standard drinks    Types: 14 Glasses of wine per week    Comment:  5 days a week wine,beer  . Drug use: No  . Sexual activity: Yes    Partners: Female  Lifestyle  . Physical activity    Days per week: Not on file    Minutes per session: Not on file  . Stress: Not on file  Relationships  . Social Herbalist on phone: Not on file    Gets together: Not on file    Attends religious service: Not on file    Active member of club or organization: Not on file    Attends meetings of clubs or organizations: Not on file    Relationship status: Not on file  Other Topics Concern  . 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  Do you drink Alcoholic Beverages-yes. Do you drink caffienated Beverages-no. Do you use seatbelt often-yes. Do you exercise at least 3 times a week-no. Is there a smoke alarm in your house-yes. Have you experienced physical abuse-no         Usual # of hours of sleep per night 6-7                        No Known Allergies Family History  Problem Relation Age of Onset  . Cancer Father        esophageal cancer-had mets but survived 78 yrs  . Esophageal cancer Father   . Diabetes Neg Hx   . Hypertension Neg Hx   . Hyperlipidemia Neg Hx   . Heart disease Neg Hx   . Colon cancer Neg Hx   . Rectal cancer Neg Hx   . Stomach cancer Neg Hx        Current Outpatient Medications (Analgesics):  .  meloxicam (MOBIC) 15 MG tablet, Take 1 tablet (15 mg total) by mouth daily.   Current Outpatient Medications (Other):  .  gabapentin (NEURONTIN) 100 MG capsule, Take 1 capsule (100 mg total) by mouth 2 (two) times daily. Marland Kitchen  gabapentin (NEURONTIN) 300 MG capsule, TAKE 1  CAPSULE BY MOUTH NIGHTLY .  omeprazole (PRILOSEC) 20 MG capsule, TAKE 1 CAPSULE (20 MG TOTAL) BY MOUTH DAILY. Marland Kitchen  zolpidem (AMBIEN CR) 12.5 MG CR tablet, Take 1 tablet (12.5 mg total) by mouth at bedtime as needed for sleep.    Past medical history, social, surgical and family history all reviewed in electronic medical record.  No pertanent information unless stated regarding to the chief complaint.   Review of Systems:  No headache, visual changes, nausea, vomiting, diarrhea, constipation, dizziness, abdominal pain, skin rash, fevers, chills, night sweats, weight loss, swollen lymph nodes, body aches, joint swelling,, chest pain, shortness of breath, mood changes.  Positive muscle aches  Objective  Blood pressure 130/70, pulse 64, height 5\' 11"  (1.803 m), weight 199 lb (90.3 kg), SpO2 98 %.    General: No apparent distress alert and oriented x3 mood and affect normal, dressed appropriately.  HEENT: Pupils equal, extraocular movements intact  Respiratory: Patient's speak in full sentences and does not appear short of breath  Cardiovascular: No lower extremity edema, non tender, no erythema  Skin: Warm dry intact with no signs of infection or rash on extremities or on axial skeleton.  Abdomen: Soft nontender  Neuro: Cranial nerves II through XII are intact, neurovascularly intact in all extremities with 2+ DTRs and 2+ pulses.  Lymph: No lymphadenopathy of posterior or anterior cervical chain or axillae bilaterally.  Gait normal with good balance and coordination.  MSK:  Non tender with full range of motion and good stability and symmetric strength and tone of shoulders, elbows, wrist, hip, knee and ankles bilaterally.  Neck: Inspection loss of lordosis. No palpable stepoffs. Mild positive Spurling's maneuver with radicular symptoms down the C8 distribution Limited range of motion lacking last 10 degrees of extension and sidebending. Grip strength and sensation normal in bilateral hands  Strength good C4 to T1 distribution No sensory change to C4 to T1 Negative Hoffman sign bilaterally Reflexes normal  Osteopathic findings C2 flexed rotated and side bent right C6 flexed rotated and side bent left T3 extended rotated and side bent right inhaled third rib T8 extended rotated and side bent right     Impression and Recommendations:     This  case required medical decision making of moderate complexity. The above documentation has been reviewed and is accurate and complete Lyndal Pulley, DO       Note: This dictation was prepared with Dragon dictation along with smaller phrase technology. Any transcriptional errors that result from this process are unintentional.

## 2019-06-11 NOTE — Assessment & Plan Note (Signed)
Continues mild weakness of the C8 distribution.  Getting a nerve conduction study but is making progress.  80% better.  Patient is responding well to osteopathic manipulation.  No change in medications.  Follow-up again in 5 to 6 weeks

## 2019-06-13 ENCOUNTER — Other Ambulatory Visit: Payer: Self-pay

## 2019-06-13 ENCOUNTER — Ambulatory Visit (INDEPENDENT_AMBULATORY_CARE_PROVIDER_SITE_OTHER): Payer: BC Managed Care – PPO | Admitting: Neurology

## 2019-06-13 DIAGNOSIS — G5621 Lesion of ulnar nerve, right upper limb: Secondary | ICD-10-CM

## 2019-06-13 DIAGNOSIS — M542 Cervicalgia: Secondary | ICD-10-CM

## 2019-06-13 NOTE — Procedures (Signed)
[facility]  [address], [address]  [place], [place] [address] Tel: [phone] Fax:  [phone] Test Date:  06/13/2019  Patient: Terrance Ortiz DOB: 1956/02/27 Physician: Narda Amber, DO  Sex: Male Height: 5\' 11"  Ref Phys: Hulan Saas, DO  ID#: [id number] Temp: 32.6C Technician:    Patient Complaints: This is a 63 year old man referred for evaluation of right neck pain and arm numbness.  NCV & EMG Findings: Extensive electrodiagnostic testing of the right upper extremity shows:  1. Right mixed palmar, median, and ulnar sensory responses are within normal limits 2. Right ulnar motor response at the first dorsal interosseous and abductor digit he minimi muscles shows decreased conduction velocity across the elbow (A Elbow-B Elbow, R45, R45 m/s).  Right median motor responses within normal limits  3. There is no evidence of active or chronic motor axonal loss changes affecting any of the tested muscles.  Motor unit configuration and recruitment pattern is within normal limits.    Impression: 1. Right ulnar neuropathy with slowing across the elbow, purely demyelinating in type, and mild in degree electrically. 2. There is no evidence of a right cervical radiculopathy.   ___________________________ Narda Amber, DO    Nerve Conduction Studies Anti Sensory Summary Table   Site NR Peak (ms) Norm Peak (ms) P-T Amp (V) Norm P-T Amp  Right Median Anti Sensory (2nd Digit)  32.6C  Wrist    3.1 <3.8 25.8 >10  Right Ulnar Anti Sensory (5th Digit)  32.6C  Wrist    3.1 <3.2 30.7 >5   Motor Summary Table   Site NR Onset (ms) Norm Onset (ms) O-P Amp (mV) Norm O-P Amp Site1 Site2 Delta-0 (ms) Dist (cm) Vel (m/s) Norm Vel (m/s)  Right Median Motor (Abd Poll Brev)  32.6C  Wrist    2.9 <4.0 12.1 >5 Elbow Wrist 5.4 32.0 59 >50  Elbow    8.3  12.0         Right Ulnar Motor (Abd Dig Minimi)  32.6C  Wrist    1.7 <3.1 8.2 >7 B Elbow Wrist 4.8 24.0 50 >50  B Elbow     6.5  7.8  A Elbow B Elbow 2.2 10.0 45 >50  A Elbow    8.7  7.7         Right Ulnar (FDI) Motor (1st DI)  32.6C  Wrist    4.0 <4.5 8.9 >7 B Elbow Wrist 4.5 24.0 53 >50  B Elbow    8.5  8.7  A Elbow B Elbow 2.2 10.0 45 >50  A Elbow    10.7  7.8          Comparison Summary Table   Site NR Peak (ms) Norm Peak (ms) P-T Amp (V) Site1 Site2 Delta-P (ms) Norm Delta (ms)  Right Median/Ulnar Palm Comparison (Wrist - 8cm)  32.6C  Median Palm    1.7 <2.2 37.8 Median Palm Ulnar Palm 0.2   Ulnar Palm    1.9 <2.2 16.1       EMG   Side Muscle Ins Act Fibs Psw Fasc Number Recrt Dur Dur. Amp Amp. Poly Poly. Comment  Right 1stDorInt Nml Nml Nml Nml Nml Nml Nml Nml Nml Nml Nml Nml N/A  Right PronatorTeres Nml Nml Nml Nml Nml Nml Nml Nml Nml Nml Nml Nml N/A  Right Biceps Nml Nml Nml Nml Nml Nml Nml Nml Nml Nml Nml Nml N/A  Right Triceps Nml Nml Nml Nml Nml Nml Nml Nml Nml Nml Nml  Nml N/A  Right Deltoid Nml Nml Nml Nml Nml Nml Nml Nml Nml Nml Nml Nml N/A  Right Cervical Parasp Low Nml Nml Nml Nml Nml Nml Nml Nml Nml Nml Nml Nml N/A  Right FlexDigProf 4,5 Nml Nml Nml Nml Nml Nml Nml Nml Nml Nml Nml Nml N/A  Right Ext Indicis Nml Nml Nml Nml Nml Nml Nml Nml Nml Nml Nml Nml N/A      Waveforms:

## 2019-06-25 ENCOUNTER — Encounter: Payer: BC Managed Care – PPO | Admitting: Neurology

## 2019-07-23 ENCOUNTER — Ambulatory Visit: Payer: BC Managed Care – PPO | Admitting: Family Medicine

## 2019-08-01 DIAGNOSIS — R6889 Other general symptoms and signs: Secondary | ICD-10-CM | POA: Diagnosis not present

## 2019-08-28 IMAGING — CT CT CHEST LUNG CANCER SCREENING LOW DOSE
1 of 2 series · 10 of 20 positions shown, 13 images · non-contrast
Comparison: 02/21/2018

CLINICAL DATA: Lung cancer screening. Former smoker. Thirty
pack-year history. Asymptomatic.

EXAM:
CT CHEST WITHOUT CONTRAST LOW-DOSE FOR LUNG CANCER SCREENING
TECHNIQUE: Multidetector CT imaging of the chest was performed following the
standard protocol without IV contrast.

[ct lung segmentation data · axial · 0.80mm/px · z∈[-318,-318]mm · 10 of 305 frames shown]
[frame 1/305  mediastinal]
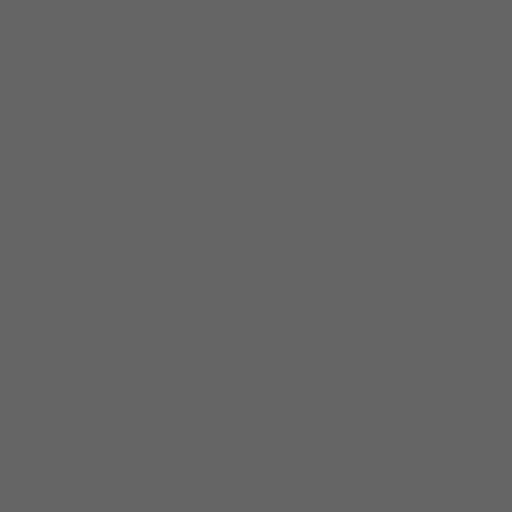
[frame 1/305  lung]
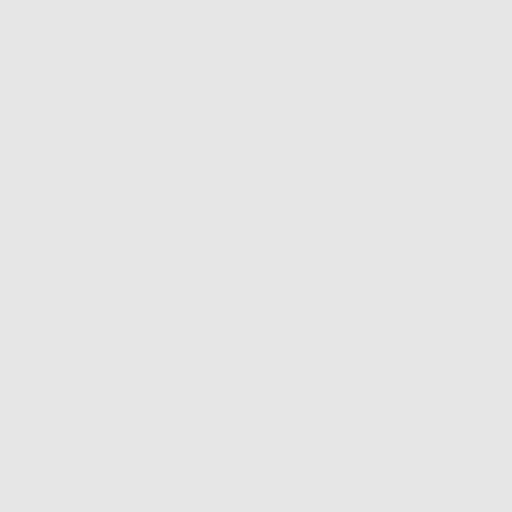
[frame 34/305  lung]
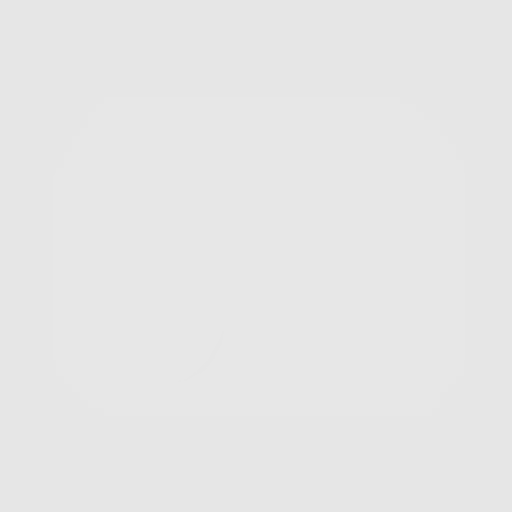
[frame 68/305  lung]
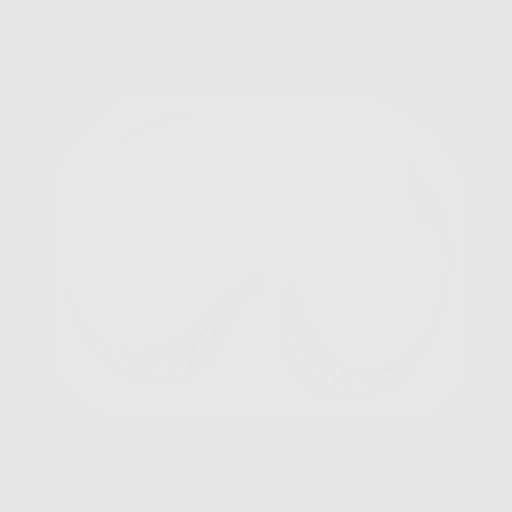
[frame 102/305  lung]
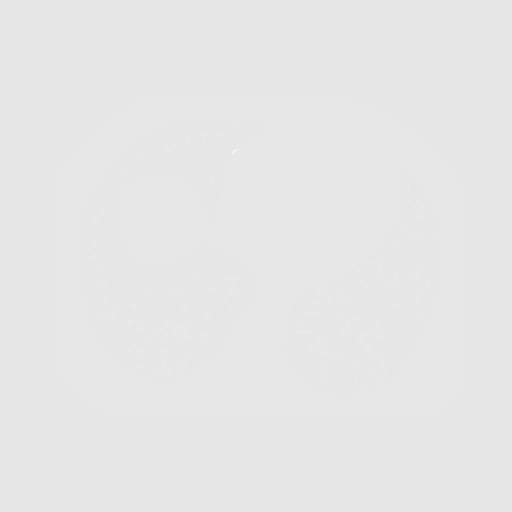
[frame 136/305  mediastinal]
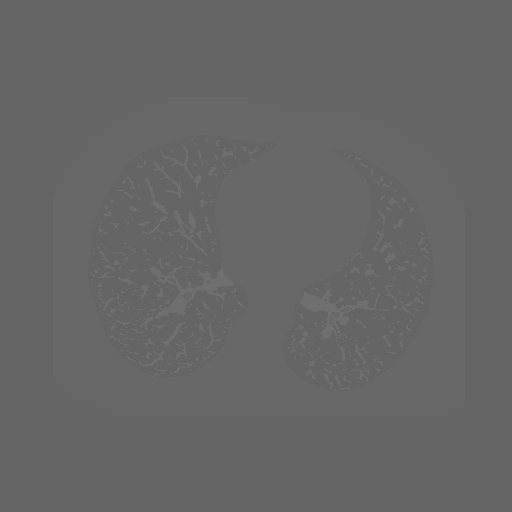
[frame 136/305  lung]
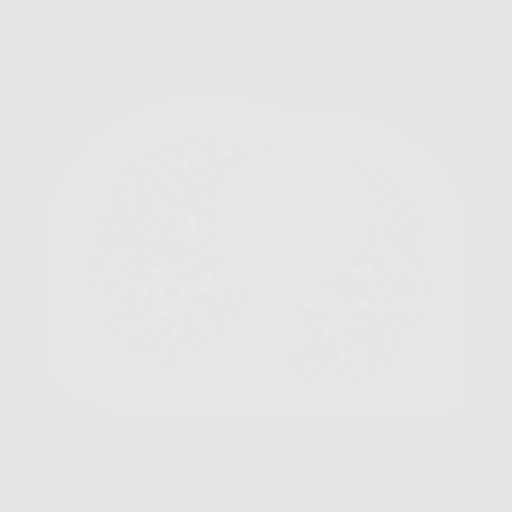
[frame 169/305  lung]
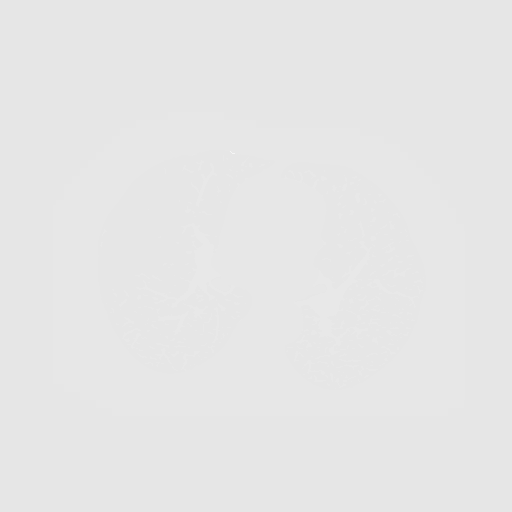
[frame 203/305  lung]
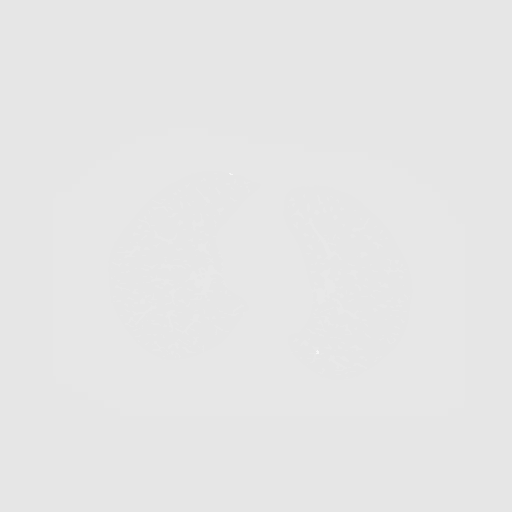
[frame 237/305  lung]
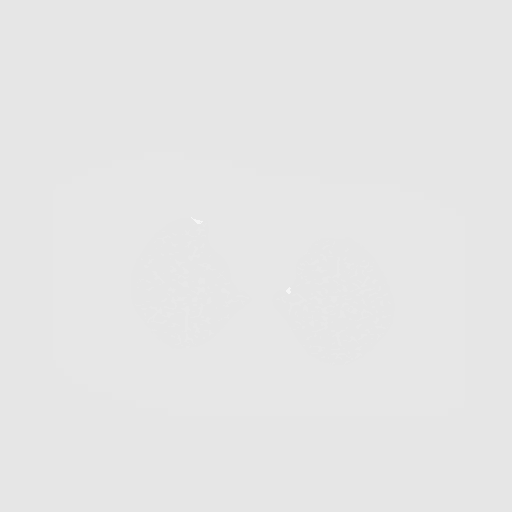
[frame 271/305  mediastinal]
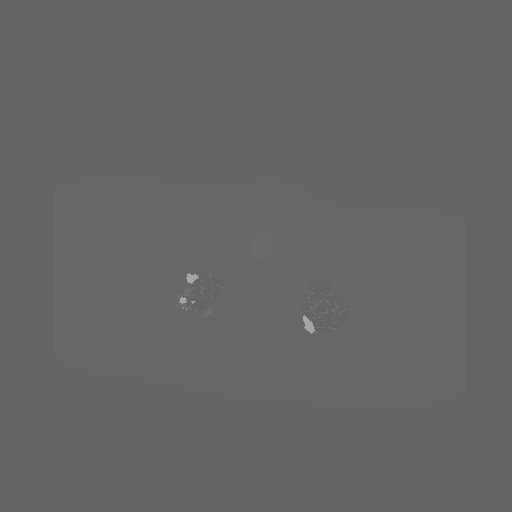
[frame 271/305  lung]
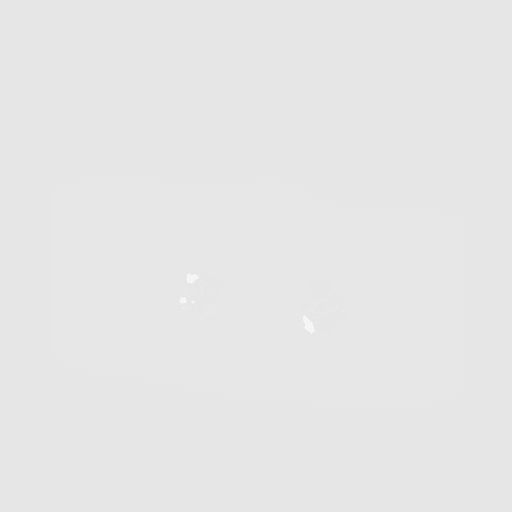
[frame 305/305  lung]
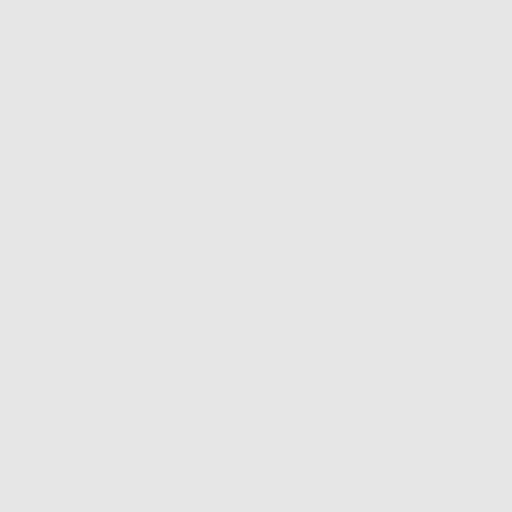

[10 of 20 positions shown; findings below may reference images not displayed]

FINDINGS: Cardiovascular: The heart size appears within normal limits. No
pericardial effusion.

Mediastinum/Nodes: Normal appearance of the thyroid gland. The
trachea appears patent and is midline. Normal appearance of the
esophagus. No mediastinal or hilar adenopathy.

Lungs/Pleura: Small pulmonary nodule in the lateral right lung base
measures 6 mm and is unchanged from previous exam. No new pulmonary
nodules identified.

Upper Abdomen: No acute abnormality.

Musculoskeletal: No chest wall mass or suspicious bone lesions
identified.
IMPRESSION: 1. Lung-RADS 2, benign appearance or behavior. Continue annual
screening with low-dose chest CT without contrast in 12 months.

## 2019-11-11 DIAGNOSIS — Z119 Encounter for screening for infectious and parasitic diseases, unspecified: Secondary | ICD-10-CM | POA: Diagnosis not present

## 2020-02-14 ENCOUNTER — Encounter: Payer: BLUE CROSS/BLUE SHIELD | Admitting: Internal Medicine

## 2020-02-18 ENCOUNTER — Encounter: Payer: BC Managed Care – PPO | Admitting: Internal Medicine

## 2020-02-24 ENCOUNTER — Encounter: Payer: BC Managed Care – PPO | Admitting: Internal Medicine

## 2020-03-09 ENCOUNTER — Encounter: Payer: Self-pay | Admitting: Internal Medicine

## 2020-03-09 ENCOUNTER — Other Ambulatory Visit: Payer: Self-pay

## 2020-03-09 ENCOUNTER — Ambulatory Visit (INDEPENDENT_AMBULATORY_CARE_PROVIDER_SITE_OTHER): Payer: BC Managed Care – PPO | Admitting: Internal Medicine

## 2020-03-09 VITALS — BP 128/78 | HR 62 | Temp 98.9°F | Ht 71.0 in | Wt 212.0 lb

## 2020-03-09 DIAGNOSIS — Z87891 Personal history of nicotine dependence: Secondary | ICD-10-CM

## 2020-03-09 DIAGNOSIS — K2270 Barrett's esophagus without dysplasia: Secondary | ICD-10-CM

## 2020-03-09 DIAGNOSIS — Z0000 Encounter for general adult medical examination without abnormal findings: Secondary | ICD-10-CM

## 2020-03-09 DIAGNOSIS — G4700 Insomnia, unspecified: Secondary | ICD-10-CM | POA: Diagnosis not present

## 2020-03-09 NOTE — Assessment & Plan Note (Signed)
Flu shot due in fall. Covid-19 complete. Shingrix complete. Tetanus up to date. Colonoscopy up to date. Counseled about sun safety and mole surveillance. Counseled about the dangers of distracted driving. Given 10 year screening recommendations.

## 2020-03-09 NOTE — Patient Instructions (Signed)

## 2020-03-09 NOTE — Assessment & Plan Note (Signed)
Using benadryl instead for sleep which is effective at this time.

## 2020-03-09 NOTE — Assessment & Plan Note (Signed)
Taking omeprazole 20 mg daily and advised to continue.

## 2020-03-09 NOTE — Assessment & Plan Note (Signed)
Still doing yearly CT screenings due July 2021. We talked about how at some time he may no longer be eligible for those as he is close to 10 years quit from smoking.

## 2020-03-09 NOTE — Progress Notes (Signed)
   Subjective:   Patient ID: Terrance Ortiz, male    DOB: Oct 24, 1956, 64 y.o.   MRN: [id number]  HPI The patient is a 64 YO man coming in for physical.   PMH, [place], social history reviewed and updated  Review of Systems  Constitutional: Negative.   HENT: Negative.   Eyes: Negative.   Respiratory: Negative for cough, chest tightness and shortness of breath.   Cardiovascular: Negative for chest pain, palpitations and leg swelling.  Gastrointestinal: Negative for abdominal distention, abdominal pain, constipation, diarrhea, nausea and vomiting.  Musculoskeletal: Negative.   Skin: Negative.   Neurological: Negative.   Psychiatric/Behavioral: Negative.     Objective:  Physical Exam Constitutional:      Appearance: He is well-developed.  HENT:     Head: Normocephalic and atraumatic.  Cardiovascular:     Rate and Rhythm: Normal rate and regular rhythm.  Pulmonary:     Effort: Pulmonary effort is normal. No respiratory distress.     Breath sounds: Normal breath sounds. No wheezing or rales.  Abdominal:     General: Bowel sounds are normal. There is no distension.     Palpations: Abdomen is soft.     Tenderness: There is no abdominal tenderness. There is no rebound.  Musculoskeletal:     Cervical back: Normal range of motion.  Skin:    General: Skin is warm and dry.  Neurological:     Mental Status: He is alert and oriented to person, place, and time.     Coordination: Coordination normal.     Vitals:   03/09/20 0917  BP: 128/78  Pulse: 62  Temp: 98.9 F (37.2 C)  SpO2: 100%  Weight: 212 lb (96.2 kg)  Height: 5\' 11"  (1.803 m)    This visit occurred during the SARS-CoV-2 public health emergency.  Safety protocols were in place, including screening questions prior to the visit, additional usage of staff PPE, and extensive cleaning of exam room while observing appropriate contact time as indicated for disinfecting solutions.   Assessment & Plan:

## 2020-06-10 ENCOUNTER — Ambulatory Visit (INDEPENDENT_AMBULATORY_CARE_PROVIDER_SITE_OTHER)
Admission: RE | Admit: 2020-06-10 | Discharge: 2020-06-10 | Disposition: A | Discharge status: Home / Self Care | Payer: BC Managed Care – PPO | Source: Ambulatory Visit | Attending: Cardiovascular Disease | Admitting: Cardiovascular Disease

## 2020-06-10 ENCOUNTER — Other Ambulatory Visit: Payer: Self-pay

## 2020-06-10 DIAGNOSIS — Z87891 Personal history of nicotine dependence: Secondary | ICD-10-CM

## 2020-06-10 DIAGNOSIS — Z122 Encounter for screening for malignant neoplasm of respiratory organs: Secondary | ICD-10-CM

## 2020-06-11 NOTE — Progress Notes (Signed)
Please call patient and let them  know their  low dose Ct was read as a Lung RADS 2: nodules that are benign in appearance and behavior with a very low likelihood of becoming a clinically active cancer due to size or lack of growth. Recommendation per radiology is for a repeat LDCT in 12 months. .Please let them  know we will order and schedule their  annual screening scan for 05/2021. Please let them  know there was notation of CAD on their  scan.  Please remind the patient  that this is a non-gated exam therefore degree or severity of disease  cannot be determined. Please have them  follow up with their PCP regarding potential risk factor modification, dietary therapy or pharmacologic therapy if clinically indicated. Pt.  is not currently on statin therapy. Please place order for annual  screening scan for  05/2021 and fax results to PCP. Thanks so much.

## 2020-06-15 ENCOUNTER — Other Ambulatory Visit: Payer: Self-pay | Admitting: *Deleted

## 2020-06-15 DIAGNOSIS — Z87891 Personal history of nicotine dependence: Secondary | ICD-10-CM

## 2020-07-28 DIAGNOSIS — H524 Presbyopia: Secondary | ICD-10-CM | POA: Diagnosis not present

## 2020-10-22 DIAGNOSIS — R3915 Urgency of urination: Secondary | ICD-10-CM | POA: Diagnosis not present

## 2020-10-22 DIAGNOSIS — N5201 Erectile dysfunction due to arterial insufficiency: Secondary | ICD-10-CM | POA: Diagnosis not present

## 2020-10-22 DIAGNOSIS — N401 Enlarged prostate with lower urinary tract symptoms: Secondary | ICD-10-CM | POA: Diagnosis not present

## 2020-10-22 DIAGNOSIS — R350 Frequency of micturition: Secondary | ICD-10-CM | POA: Diagnosis not present

## 2021-01-01 ENCOUNTER — Encounter: Payer: Self-pay | Admitting: Internal Medicine

## 2021-03-10 ENCOUNTER — Ambulatory Visit (INDEPENDENT_AMBULATORY_CARE_PROVIDER_SITE_OTHER): Payer: BC Managed Care – PPO | Admitting: Internal Medicine

## 2021-03-10 ENCOUNTER — Encounter: Payer: Self-pay | Admitting: Internal Medicine

## 2021-03-10 ENCOUNTER — Other Ambulatory Visit: Payer: Self-pay

## 2021-03-10 VITALS — BP 118/80 | HR 64 | Temp 98.3°F | Resp 18 | Ht 71.0 in | Wt 211.4 lb

## 2021-03-10 DIAGNOSIS — K2270 Barrett's esophagus without dysplasia: Secondary | ICD-10-CM | POA: Diagnosis not present

## 2021-03-10 DIAGNOSIS — Z87891 Personal history of nicotine dependence: Secondary | ICD-10-CM | POA: Diagnosis not present

## 2021-03-10 DIAGNOSIS — Z0000 Encounter for general adult medical examination without abnormal findings: Secondary | ICD-10-CM | POA: Diagnosis not present

## 2021-03-10 DIAGNOSIS — I700 Atherosclerosis of aorta: Secondary | ICD-10-CM | POA: Diagnosis not present

## 2021-03-10 NOTE — Assessment & Plan Note (Signed)
Discussed finding and overall CV risk 8.5% 10 year. We have agreed to check cholesterol and treat with statin if not at goal.

## 2021-03-10 NOTE — Assessment & Plan Note (Signed)
Due endoscopy 2022 and we talked about colon due 2023 he may be able to talk to GI and push back the EGD to 2023. Taking prilosec 20 mg daily.

## 2021-03-10 NOTE — Assessment & Plan Note (Signed)
10 years quit and still doing lung cancer screening yearly.

## 2021-03-10 NOTE — Patient Instructions (Addendum)
We will check the labs fasting.   Health Maintenance, Male Adopting a healthy lifestyle and getting preventive care are important in promoting health and wellness. Ask your health care provider about:  The right schedule for you to have regular tests and exams.  Things you can do on your own to prevent diseases and keep yourself healthy. What should I know about diet, weight, and exercise? Eat a healthy diet  Eat a diet that includes plenty of vegetables, fruits, low-fat dairy products, and lean protein.  Do not eat a lot of foods that are high in solid fats, added sugars, or sodium.   Maintain a healthy weight Body mass index (BMI) is a measurement that can be used to identify possible weight problems. It estimates body fat based on height and weight. Your health care provider can help determine your BMI and help you achieve or maintain a healthy weight. Get regular exercise Get regular exercise. This is one of the most important things you can do for your health. Most adults should:  Exercise for at least 150 minutes each week. The exercise should increase your heart rate and make you sweat (moderate-intensity exercise).  Do strengthening exercises at least twice a week. This is in addition to the moderate-intensity exercise.  Spend less time sitting. Even light physical activity can be beneficial. Watch cholesterol and blood lipids Have your blood tested for lipids and cholesterol at 64 years of age, then have this test every 5 years. You may need to have your cholesterol levels checked more often if:  Your lipid or cholesterol levels are high.  You are older than 64 years of age.  You are at high risk for heart disease. What should I know about cancer screening? Many types of cancers can be detected early and may often be prevented. Depending on your health history and family history, you may need to have cancer screening at various ages. This may include screening  for:  Colorectal cancer.  Prostate cancer.  Skin cancer.  Lung cancer. What should I know about heart disease, diabetes, and high blood pressure? Blood pressure and heart disease  High blood pressure causes heart disease and increases the risk of stroke. This is more likely to develop in people who have high blood pressure readings, are of African descent, or are overweight.  Talk with your health care provider about your target blood pressure readings.  Have your blood pressure checked: ? Every 3-5 years if you are 64-80 years of age. ? Every year if you are 64 years old or older.  If you are between the ages of 71 and 26 and are a current or former smoker, ask your health care provider if you should have a one-time screening for abdominal aortic aneurysm (AAA). Diabetes Have regular diabetes screenings. This checks your fasting blood sugar level. Have the screening done:  Once every three years after age 64 if you are at a normal weight and have a low risk for diabetes.  More often and at a younger age if you are overweight or have a high risk for diabetes. What should I know about preventing infection? Hepatitis B If you have a higher risk for hepatitis B, you should be screened for this virus. Talk with your health care provider to find out if you are at risk for hepatitis B infection. Hepatitis C Blood testing is recommended for:  Everyone born from [birth year] through 1965.  Anyone with known risk factors for hepatitis C. Sexually transmitted  infections (STIs)  You should be screened each year for STIs, including gonorrhea and chlamydia, if: ? You are sexually active and are younger than 64 years of age. ? You are older than 64 years of age and your health care provider tells you that you are at risk for this type of infection. ? Your sexual activity has changed since you were last screened, and you are at increased risk for chlamydia or gonorrhea. Ask your health care  provider if you are at risk.  Ask your health care provider about whether you are at high risk for HIV. Your health care provider may recommend a prescription medicine to help prevent HIV infection. If you choose to take medicine to prevent HIV, you should first get tested for HIV. You should then be tested every 3 months for as long as you are taking the medicine. Follow these instructions at home: Lifestyle  Do not use any products that contain nicotine or tobacco, such as cigarettes, e-cigarettes, and chewing tobacco. If you need help quitting, ask your health care provider.  Do not use street drugs.  Do not share needles.  Ask your health care provider for help if you need support or information about quitting drugs. Alcohol use  Do not drink alcohol if your health care provider tells you not to drink.  If you drink alcohol: ? Limit how much you have to 0-2 drinks a day. ? Be aware of how much alcohol is in your drink. In the [place] one drink equals one 12 oz bottle of beer (355 mL), one 5 oz glass of wine (148 mL), or one 1 oz glass of hard liquor (44 mL). General instructions  Schedule regular health, dental, and eye exams.  Stay current with your vaccines.  Tell your health care provider if: ? You often feel depressed. ? You have ever been abused or do not feel safe at home. Summary  Adopting a healthy lifestyle and getting preventive care are important in promoting health and wellness.  Follow your health care provider's instructions about healthy diet, exercising, and getting tested or screened for diseases.  Follow your health care provider's instructions on monitoring your cholesterol and blood pressure. This information is not intended to replace advice given to you by your health care provider. Make sure you discuss any questions you have with your health care provider. Document Revised: 10/31/2018 Document Reviewed: 10/31/2018 Elsevier Patient Education  2021  [employer].

## 2021-03-10 NOTE — Progress Notes (Signed)
   Subjective:   Patient ID: Terrance Ortiz, male    DOB: 1956/10/31, 65 y.o.   MRN: [id number]  HPI The patient is a 65 YO man coming in for physical.   PMH, [place], social history reviewed and updated  Review of Systems  Constitutional: Negative.   HENT: Negative.   Eyes: Negative.   Respiratory: Negative for cough, chest tightness and shortness of breath.   Cardiovascular: Negative for chest pain, palpitations and leg swelling.  Gastrointestinal: Negative for abdominal distention, abdominal pain, constipation, diarrhea, nausea and vomiting.  Musculoskeletal: Negative.   Skin: Negative.   Neurological: Negative.   Psychiatric/Behavioral: Negative.     Objective:  Physical Exam Constitutional:      Appearance: He is well-developed. He is obese.  HENT:     Head: Normocephalic and atraumatic.  Cardiovascular:     Rate and Rhythm: Normal rate and regular rhythm.  Pulmonary:     Effort: Pulmonary effort is normal. No respiratory distress.     Breath sounds: Normal breath sounds. No wheezing or rales.  Abdominal:     General: Bowel sounds are normal. There is no distension.     Palpations: Abdomen is soft.     Tenderness: There is no abdominal tenderness. There is no rebound.  Musculoskeletal:     Cervical back: Normal range of motion.  Skin:    General: Skin is warm and dry.  Neurological:     Mental Status: He is alert and oriented to person, place, and time.     Coordination: Coordination normal.     Vitals:   03/10/21 0805  BP: 118/80  Pulse: 64  Resp: 18  Temp: 98.3 F (36.8 C)  TempSrc: Oral  SpO2: 99%  Weight: 211 lb 6.4 oz (95.9 kg)  Height: 5\' 11"  (1.803 m)    This visit occurred during the SARS-CoV-2 public health emergency.  Safety protocols were in place, including screening questions prior to the visit, additional usage of staff PPE, and extensive cleaning of exam room while observing appropriate contact time as indicated for disinfecting solutions.    Assessment & Plan:

## 2021-03-10 NOTE — Addendum Note (Signed)
Addended by: Hoyt Koch on: 03/10/2021 08:36 AM   Modules accepted: Orders

## 2021-03-10 NOTE — Assessment & Plan Note (Signed)
Flu shot yearly. Covid-19 3 shots discussed 4th. Shingrix complete. Tetanus due later 2022. Colonoscopy due 2023. Counseled about sun safety and mole surveillance. Counseled about the dangers of distracted driving. Given 10 year screening recommendations.

## 2021-04-05 ENCOUNTER — Encounter: Payer: Self-pay | Admitting: Gastroenterology

## 2022-04-29 ENCOUNTER — Encounter: Payer: Self-pay | Admitting: Gastroenterology

## 2022-06-01 ENCOUNTER — Encounter: Payer: Self-pay | Admitting: Gastroenterology

## 2022-06-10 ENCOUNTER — Other Ambulatory Visit: Payer: Self-pay | Admitting: Internal Medicine

## 2022-06-10 DIAGNOSIS — Z0000 Encounter for general adult medical examination without abnormal findings: Secondary | ICD-10-CM

## 2022-07-08 ENCOUNTER — Inpatient Hospital Stay: Admission: RE | Admit: 2022-07-08 | Payer: BC Managed Care – PPO | Source: Ambulatory Visit

## 2022-07-19 ENCOUNTER — Ambulatory Visit (AMBULATORY_SURGERY_CENTER): Payer: BC Managed Care – PPO | Admitting: *Deleted

## 2022-07-19 ENCOUNTER — Telehealth: Payer: Self-pay | Admitting: *Deleted

## 2022-07-19 VITALS — Ht 71.0 in | Wt 218.8 lb

## 2022-07-19 DIAGNOSIS — Z86010 Personal history of colonic polyps: Secondary | ICD-10-CM

## 2022-07-19 DIAGNOSIS — Z8719 Personal history of other diseases of the digestive system: Secondary | ICD-10-CM

## 2022-07-19 MED ORDER — NA SULFATE-K SULFATE-MG SULF 17.5-3.13-1.6 GM/177ML PO SOLN: 1.0000 | Freq: Once | ORAL | 0 refills | Status: AC

## 2022-07-19 NOTE — Telephone Encounter (Signed)
OK colon/egd and block the 1600 slot

## 2022-07-19 NOTE — Telephone Encounter (Signed)
Dr. Fuller Plan,  I saw this pt in [place] today- his recall colonoscopy will be done on 08-17-22.  After he left, I noted he is also due for a recall EGD d/t hx Barretts- his last EGD was in 2019 and he is on a 3 year recall plan.  Is it ok to do a double procedure the day of his colonoscopy?  You do have a full morning that day- could I book it in a 30 minute spot and block your 4:00 pm that day?   If not I could offer him another day for a double or do the EGD another day.  I just wanted to make sure it if ok for him to have a double procedure.  Please advise.  Thanks, [employer]

## 2022-07-19 NOTE — Progress Notes (Signed)
  No trouble with anesthesia, denies being told they were difficult to intubate, or hx/fam hx of malignant hyperthermia per pt   No egg or soy allergy  No home oxygen use   No medications for weight loss taken  Pt denies constipation issues  Pt informed that we do not do prior authorizations for prep  See TE from 07-19-22.  EGD added onto procedure per DO.  Pt is aware

## 2022-07-19 NOTE — Telephone Encounter (Signed)
Dr. Lynne Leader recommendations noted.  Pt is made aware of this and would like to do both procedures at the same time.  1600 appt blocked for the day per DO

## 2022-08-03 ENCOUNTER — Encounter: Payer: Self-pay | Admitting: Gastroenterology

## 2022-08-17 ENCOUNTER — Encounter: Payer: Self-pay | Admitting: Gastroenterology

## 2022-08-17 ENCOUNTER — Ambulatory Visit (AMBULATORY_SURGERY_CENTER): Payer: BC Managed Care – PPO | Admitting: Gastroenterology

## 2022-08-17 VITALS — BP 138/85 | HR 71 | Temp 97.1°F | Resp 14 | Ht 71.0 in | Wt 218.0 lb

## 2022-08-17 DIAGNOSIS — K2270 Barrett's esophagus without dysplasia: Secondary | ICD-10-CM | POA: Diagnosis not present

## 2022-08-17 DIAGNOSIS — K635 Polyp of colon: Secondary | ICD-10-CM | POA: Diagnosis not present

## 2022-08-17 DIAGNOSIS — K317 Polyp of stomach and duodenum: Secondary | ICD-10-CM | POA: Diagnosis not present

## 2022-08-17 DIAGNOSIS — K449 Diaphragmatic hernia without obstruction or gangrene: Secondary | ICD-10-CM | POA: Diagnosis not present

## 2022-08-17 DIAGNOSIS — Z860101 Personal history of adenomatous and serrated colon polyps: Secondary | ICD-10-CM

## 2022-08-17 DIAGNOSIS — Z09 Encounter for follow-up examination after completed treatment for conditions other than malignant neoplasm: Secondary | ICD-10-CM

## 2022-08-17 DIAGNOSIS — Z86010 Personal history of colonic polyps: Secondary | ICD-10-CM | POA: Diagnosis not present

## 2022-08-17 DIAGNOSIS — Z8719 Personal history of other diseases of the digestive system: Secondary | ICD-10-CM

## 2022-08-17 DIAGNOSIS — D123 Benign neoplasm of transverse colon: Secondary | ICD-10-CM

## 2022-08-17 MED ORDER — SODIUM CHLORIDE 0.9 % IV SOLN: 500.0000 mL | Freq: Once | INTRAVENOUS | Status: DC

## 2022-08-17 NOTE — Progress Notes (Signed)
Pt's states no medical or surgical changes since previsit or office visit. 

## 2022-08-17 NOTE — Patient Instructions (Signed)
Handouts on hemorrhoids, polyps, and hiatal hernia. Repeat endoscopy in 3-5 years to check on Barrett's Esophagus  Repeat colonoscopy will be determined based off of pathology results Resume previous diet and continue present medications    YOU HAD AN ENDOSCOPIC PROCEDURE TODAY AT [facility]:   Refer to the procedure report that was given to you for any specific questions about what was found during the examination.  If the procedure report does not answer your questions, please call your gastroenterologist to clarify.  If you requested that your care partner not be given the details of your procedure findings, then the procedure report has been included in a sealed envelope for you to review at your convenience later.  YOU SHOULD EXPECT: Some feelings of bloating in the abdomen. Passage of more gas than usual.  Walking can help get rid of the air that was put into your GI tract during the procedure and reduce the bloating. If you had a lower endoscopy (such as a colonoscopy or flexible sigmoidoscopy) you may notice spotting of blood in your stool or on the toilet paper. If you underwent a bowel prep for your procedure, you may not have a normal bowel movement for a few days.  Please Note:  You might notice some irritation and congestion in your nose or some drainage.  This is from the oxygen used during your procedure.  There is no need for concern and it should clear up in a day or so.  SYMPTOMS TO REPORT IMMEDIATELY:  Following lower endoscopy (colonoscopy or flexible sigmoidoscopy):  Excessive amounts of blood in the stool  Significant tenderness or worsening of abdominal pains  Swelling of the abdomen that is new, acute  Fever of 100F or higher  Following upper endoscopy (EGD)  Vomiting of blood or coffee ground material  New chest pain or pain under the shoulder blades  Painful or persistently difficult swallowing  New shortness of breath  Fever of 100F or  higher  Black, tarry-looking stools  For urgent or emergent issues, a gastroenterologist can be reached at any hour by calling [phone]. Do not use MyChart messaging for urgent concerns.    DIET:  We do recommend a small meal at first, but then you may proceed to your regular diet.  Drink plenty of fluids but you should avoid alcoholic beverages for 24 hours.  ACTIVITY:  You should plan to take it easy for the rest of today and you should NOT DRIVE or use heavy machinery until tomorrow (because of the sedation medicines used during the test).    FOLLOW UP: Our staff will call the number listed on your records the next business day following your procedure.  We will call around 7:15- 8:00 am to check on you and address any questions or concerns that you may have regarding the information given to you following your procedure. If we do not reach you, we will leave a message.     If any biopsies were taken you will be contacted by phone or by letter within the next 1-3 weeks.  Please call us at [phone] if you have not heard about the biopsies in 3 weeks.    SIGNATURES/CONFIDENTIALITY: You and/or your care partner have signed paperwork which will be entered into your electronic medical record.  These signatures attest to the fact that that the information above on your After Visit Summary has been reviewed and is understood.  Full responsibility of the confidentiality of this discharge  information lies with you and/or your care-partner.

## 2022-08-17 NOTE — Progress Notes (Signed)
To pacu, VSS. Report to [email or web address] 

## 2022-08-17 NOTE — Progress Notes (Signed)
Called to room to assist during endoscopic procedure.  Patient ID and intended procedure confirmed with present staff. Received instructions for my participation in the procedure from the performing physician.  

## 2022-08-17 NOTE — Op Note (Signed)
[facility] Patient Name: Terrance Ortiz Procedure Date: 08/17/2022 10:33 AM MRN: [id number] Endoscopist: Ladene Artist , MD Age: 66 Referring MD:  Date of Birth: 07-25-1956 Gender: Male Account #: [id number] Procedure:                Colonoscopy Indications:              Surveillance: Personal history of adenomatous                            polyps on last colonoscopy 5 years ago Medicines:                Monitored Anesthesia Care Procedure:                Pre-Anesthesia Assessment:                           - Prior to the procedure, a History and Physical                            was performed, and patient medications and                            allergies were reviewed. The patient's tolerance of                            previous anesthesia was also reviewed. The risks                            and benefits of the procedure and the sedation                            options and risks were discussed with the patient.                            All questions were answered, and informed consent                            was obtained. Prior Anticoagulants: The patient has                            taken no previous anticoagulant or antiplatelet                            agents. ASA Grade Assessment: II - A patient with                            mild systemic disease. After reviewing the risks                            and benefits, the patient was deemed in                            satisfactory condition to undergo the procedure.  After obtaining informed consent, the colonoscope                            was passed under direct vision. Throughout the                            procedure, the patient's blood pressure, pulse, and                            oxygen saturations were monitored continuously. The                            Colonoscope was introduced through the anus and                            advanced to the the  cecum, identified by                            appendiceal orifice and ileocecal valve. The                            ileocecal valve, appendiceal orifice, and rectum                            were photographed. The quality of the bowel                            preparation was excellent. The colonoscopy was                            performed without difficulty. The patient tolerated                            the procedure well. Scope In: 10:36:45 AM Scope Out: 10:54:19 AM Scope Withdrawal Time: 0 hours 14 minutes 6 seconds  Total Procedure Duration: 0 hours 17 minutes 34 seconds  Findings:                 The perianal and digital rectal examinations were                            normal.                           Three sessile polyps were found in the transverse                            colon. The polyps were 3 to 7 mm in size. These                            polyps were removed with a cold snare. Resection                            and retrieval were complete.  Internal hemorrhoids were found during                            retroflexion. The hemorrhoids were small and Grade                            I (internal hemorrhoids that do not prolapse).                           The exam was otherwise without abnormality on                            direct and retroflexion views. Complications:            No immediate complications. Estimated blood loss:                            None. Estimated Blood Loss:     Estimated blood loss: none. Impression:               - Three 3 to 7 mm polyps in the transverse colon,                            removed with a cold snare. Resected and retrieved.                           - Internal hemorrhoids.                           - The examination was otherwise normal on direct                            and retroflexion views. Recommendation:           - Repeat colonoscopy after studies are complete for                             surveillance based on pathology results.                           - Patient has a contact number available for                            emergencies. The signs and symptoms of potential                            delayed complications were discussed with the                            patient. Return to normal activities tomorrow.                            Written discharge instructions were provided to the                            patient.                           -  Resume previous diet.                           - Continue present medications.                           - Await pathology results. Ladene Artist, MD 08/17/2022 10:56:56 AM This report has been signed electronically.

## 2022-08-17 NOTE — Progress Notes (Signed)
History & Physical  Primary Care Physician:  Velna Hatchet, MD Primary Gastroenterologist: Lucio Edward, MD  CHIEF COMPLAINT: Barrett's esophagus, Personal history of colon polyps   HPI: Terrance Ortiz is a 66 y.o. male with a personal history of adenomatous colon polyps and Barrett's esophagus for colonoscopy and EGD.   Past Medical History:  Diagnosis Date   Barrett's esophagus    GERD (gastroesophageal reflux disease)    Knee pain    recurrent intermittent tendonitis   Obesity    Sleep disorder    Tubular adenoma of colon 09/2011   Varicella     Past Surgical History:  Procedure Laterality Date   APPENDECTOMY     COLONOSCOPY     fracture collar bone     no surgery   LAPAROSCOPIC APPENDECTOMY N/A 01/17/2015   Procedure: APPENDECTOMY LAPAROSCOPIC;  Surgeon: Doreen Salvage, MD;  Location: [facility];  Service: General;  Laterality: N/A;   [place]      Prior to Admission medications   Medication Sig Start Date End Date Taking? Authorizing Provider  omeprazole (PRILOSEC) 20 MG capsule TAKE 1 CAPSULE (20 MG TOTAL) BY MOUTH DAILY. 04/18/17  Yes Hoyt Koch, MD    Current Outpatient Medications  Medication Sig Dispense Refill   omeprazole (PRILOSEC) 20 MG capsule TAKE 1 CAPSULE (20 MG TOTAL) BY MOUTH DAILY. 90 capsule 3   Current Facility-Administered Medications  Medication Dose Route Frequency Provider Last Rate Last Admin   0.9 %  sodium chloride infusion  500 mL Intravenous Once Ladene Artist, MD        Allergies as of 08/17/2022   (No Known Allergies)    Family History  Problem Relation Age of Onset   Cancer Father        esophageal cancer-had mets but survived 60 yrs   Esophageal cancer Father    Diabetes Neg Hx    Hypertension Neg Hx    Hyperlipidemia Neg Hx    Heart disease Neg Hx    Colon cancer Neg Hx    Rectal cancer Neg Hx    Stomach cancer  Neg Hx     Social History   Socioeconomic History   Marital status: Married    Spouse name: Not on file   Number of children: 2   Years of education: 16   Highest education level: Not on file  Occupational History   Occupation: Real Estate   Tobacco Use   Smoking status: Former    Packs/day: 1.00    Years: 30.00    Total pack years: 30.00    Types: Cigarettes    Quit date: 01/11/2011    Years since quitting: 11.6   Smokeless tobacco: Never  Vaping Use   Vaping Use: Never used  Substance and Sexual Activity   Alcohol use: Yes    Alcohol/week: 14.0 standard drinks of alcohol    Types: 14 Glasses of wine per week    Comment:  5 days a week wine,beer   Drug use: No   Sexual activity: Yes    Partners: Female  Other Topics Concern   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  Do you drink Alcoholic Beverages-yes. Do you drink caffienated Beverages-no. Do you use seatbelt often-yes. Do you exercise at least 3 times a week-no. Is there a smoke alarm in your house-yes. Have you experienced physical abuse-no         Usual # of hours of sleep per night 6-7                        Social Determinants of Health   Financial Resource Strain: Not on file  Food Insecurity: Not on file  Transportation Needs: Not on file  Physical Activity: Not on file  Stress: Not on file  Social Connections: Not on file  Intimate Partner Violence: Not on file    Review of Systems:  All systems reviewed were negative except where noted in HPI.   Physical Exam: General:  Alert, well-developed, in NAD Head:  Normocephalic and atraumatic. Eyes:  Sclera clear, no icterus.   Conjunctiva pink. Ears:  Normal auditory acuity. Mouth:  No deformity or lesions.  Neck:  Supple; no masses . Lungs:  Clear  throughout to auscultation.   No wheezes, crackles, or rhonchi. No acute distress. Heart:  Regular rate and rhythm; no murmurs. Abdomen:  Soft, nondistended, nontender. No masses, hepatomegaly. No obvious masses.  Normal bowel .    Rectal:  Deferred   Msk:  Symmetrical without gross deformities.. Pulses:  Normal pulses noted. Extremities:  Without edema. Neurologic:  Alert and  oriented x4;  grossly normal neurologically. Skin:  Intact without significant lesions or rashes. Cervical Nodes:  No significant cervical adenopathy. Psych:  Alert and cooperative. Normal mood and affect.   Impression / Plan:   Personal history of adenomatous colon polyps and Barrett's esophagus for colonoscopy and EGD.  Pricilla Riffle. Fuller Plan  08/17/2022, 10:26 AM See Shea Evans, [facility], to contact our on call provider

## 2022-08-17 NOTE — Op Note (Signed)
[facility] Patient Name: Terrance Ortiz Procedure Date: 08/17/2022 10:32 AM MRN: [id number] Endoscopist: Ladene Artist , MD Age: 66 Referring MD:  Date of Birth: Feb 08, 1956 Gender: Male Account #: [id number] Procedure:                Upper GI endoscopy Indications:              Surveillance for malignancy due to personal history                            of Barrett's esophagus Medicines:                Monitored Anesthesia Care Procedure:                Pre-Anesthesia Assessment:                           - Prior to the procedure, a History and Physical                            was performed, and patient medications and                            allergies were reviewed. The patient's tolerance of                            previous anesthesia was also reviewed. The risks                            and benefits of the procedure and the sedation                            options and risks were discussed with the patient.                            All questions were answered, and informed consent                            was obtained. Prior Anticoagulants: The patient has                            taken no previous anticoagulant or antiplatelet                            agents. ASA Grade Assessment: II - A patient with                            mild systemic disease. After reviewing the risks                            and benefits, the patient was deemed in                            satisfactory condition to undergo the procedure.  After obtaining informed consent, the endoscope was                            passed under direct vision. Throughout the                            procedure, the patient's blood pressure, pulse, and                            oxygen saturations were monitored continuously. The                            GIF [id number] #[id number] was introduced through the                            mouth, and advanced to the  second part of duodenum.                            The upper GI endoscopy was accomplished without                            difficulty. The patient tolerated the procedure                            well. Scope In: Scope Out: Findings:                 There were esophageal mucosal changes secondary to                            established short-segment Barrett's disease present                            in the distal esophagus. The maximum longitudinal                            extent of these mucosal changes was 2 cm in length.                            Mucosa was biopsied with a cold forceps for                            histology. One specimen bottle was sent to                            pathology.                           The exam of the esophagus was otherwise normal.                           A small hiatal hernia was present.                           Multiple 4 to 6 mm sessile polyps  with no bleeding                            and no stigmata of recent bleeding were found in                            the gastric fundus and in the gastric body.                            Biopsies were taken with a cold forceps for                            histology.                           The exam of the stomach was otherwise normal.                           The duodenal bulb and second portion of the                            duodenum were normal. Complications:            No immediate complications. Estimated Blood Loss:     Estimated blood loss was minimal. Impression:               - Esophageal mucosal changes secondary to                            established short-segment Barrett's disease.                            Biopsied.                           - Small hiatal hernia.                           - Multiple gastric polyps. Biopsied.                           - Normal duodenal bulb and second portion of the                            duodenum. Recommendation:            - Patient has a contact number available for                            emergencies. The signs and symptoms of potential                            delayed complications were discussed with the                            patient. Return to normal activities tomorrow.  Written discharge instructions were provided to the                            patient.                           - Continue present medications.                           - Follow antireflux measures long term.                           - Await pathology results.                           - Repeat upper endoscopy in 3-5 years for                            surveillance of Barrett's esophagus pending                            pathology review. Ladene Artist, MD 08/17/2022 11:12:50 AM This report has been signed electronically.

## 2022-08-18 ENCOUNTER — Telehealth: Payer: Self-pay | Admitting: *Deleted

## 2022-08-18 NOTE — Telephone Encounter (Signed)
  Follow up Call-     08/17/2022   10:11 AM  Call back number  Post procedure Call Back phone  # [phone]  Permission to leave phone message Yes     Patient questions:  Do you have a fever, pain , or abdominal swelling? No. Pain Score  0 *  Have you tolerated food without any problems? Yes.    Have you been able to return to your normal activities? Yes.    Do you have any questions about your discharge instructions: Diet   No. Medications  No. Follow up visit  No.  Do you have questions or concerns about your Care? No.  Actions: * If pain score is 4 or above: No action needed, pain <4.

## 2022-08-25 DIAGNOSIS — Z125 Encounter for screening for malignant neoplasm of prostate: Secondary | ICD-10-CM | POA: Diagnosis not present

## 2022-08-25 DIAGNOSIS — Z0000 Encounter for general adult medical examination without abnormal findings: Secondary | ICD-10-CM | POA: Diagnosis not present

## 2022-08-25 DIAGNOSIS — R7989 Other specified abnormal findings of blood chemistry: Secondary | ICD-10-CM | POA: Diagnosis not present

## 2022-08-31 ENCOUNTER — Encounter: Payer: Self-pay | Admitting: Gastroenterology

## 2022-08-31 DIAGNOSIS — Z0000 Encounter for general adult medical examination without abnormal findings: Secondary | ICD-10-CM | POA: Diagnosis not present

## 2022-08-31 DIAGNOSIS — Z23 Encounter for immunization: Secondary | ICD-10-CM | POA: Diagnosis not present

## 2022-09-19 ENCOUNTER — Other Ambulatory Visit: Payer: Self-pay | Admitting: *Deleted

## 2022-09-19 DIAGNOSIS — Z87891 Personal history of nicotine dependence: Secondary | ICD-10-CM

## 2022-09-19 DIAGNOSIS — Z122 Encounter for screening for malignant neoplasm of respiratory organs: Secondary | ICD-10-CM

## 2022-10-18 ENCOUNTER — Telehealth: Payer: Self-pay | Admitting: Acute Care

## 2022-10-18 NOTE — Telephone Encounter (Signed)
PCCs, please advise on this.

## 2022-10-18 NOTE — Telephone Encounter (Signed)
Or TC will be cx.Terrance Ortiz

## 2022-10-19 ENCOUNTER — Ambulatory Visit
Admission: RE | Admit: 2022-10-19 | Discharge: 2022-10-19 | Disposition: A | Discharge status: Home / Self Care | Payer: BC Managed Care – PPO | Source: Ambulatory Visit | Attending: Acute Care | Admitting: Acute Care

## 2022-10-19 DIAGNOSIS — Z122 Encounter for screening for malignant neoplasm of respiratory organs: Secondary | ICD-10-CM

## 2022-10-19 DIAGNOSIS — Z87891 Personal history of nicotine dependence: Secondary | ICD-10-CM

## 2022-10-19 DIAGNOSIS — I2510 Atherosclerotic heart disease of native coronary artery without angina pectoris: Secondary | ICD-10-CM | POA: Diagnosis not present

## 2022-10-19 DIAGNOSIS — J439 Emphysema, unspecified: Secondary | ICD-10-CM | POA: Diagnosis not present

## 2022-10-19 DIAGNOSIS — I700 Atherosclerosis of aorta: Secondary | ICD-10-CM | POA: Diagnosis not present

## 2022-10-20 NOTE — Telephone Encounter (Signed)
Auth was received & documented on order.  Nothing further needed at this time.

## 2022-10-21 ENCOUNTER — Other Ambulatory Visit: Payer: Self-pay | Admitting: Acute Care

## 2022-10-21 DIAGNOSIS — Z87891 Personal history of nicotine dependence: Secondary | ICD-10-CM

## 2022-10-21 DIAGNOSIS — Z122 Encounter for screening for malignant neoplasm of respiratory organs: Secondary | ICD-10-CM

## 2022-11-07 DIAGNOSIS — S0502XA Injury of conjunctiva and corneal abrasion without foreign body, left eye, initial encounter: Secondary | ICD-10-CM | POA: Diagnosis not present

## 2023-08-06 ENCOUNTER — Other Ambulatory Visit: Payer: Self-pay | Admitting: Acute Care

## 2023-08-06 DIAGNOSIS — Z122 Encounter for screening for malignant neoplasm of respiratory organs: Secondary | ICD-10-CM

## 2023-08-06 DIAGNOSIS — Z87891 Personal history of nicotine dependence: Secondary | ICD-10-CM

## 2023-09-12 DIAGNOSIS — Z1389 Encounter for screening for other disorder: Secondary | ICD-10-CM | POA: Diagnosis not present

## 2023-09-19 ENCOUNTER — Other Ambulatory Visit (HOSPITAL_COMMUNITY): Payer: Self-pay

## 2023-09-19 DIAGNOSIS — Z0000 Encounter for general adult medical examination without abnormal findings: Secondary | ICD-10-CM | POA: Diagnosis not present

## 2023-09-19 DIAGNOSIS — R82998 Other abnormal findings in urine: Secondary | ICD-10-CM | POA: Diagnosis not present

## 2023-09-19 DIAGNOSIS — Z23 Encounter for immunization: Secondary | ICD-10-CM | POA: Diagnosis not present

## 2023-09-19 DIAGNOSIS — Z1339 Encounter for screening examination for other mental health and behavioral disorders: Secondary | ICD-10-CM | POA: Diagnosis not present

## 2023-09-19 DIAGNOSIS — Z1331 Encounter for screening for depression: Secondary | ICD-10-CM | POA: Diagnosis not present

## 2023-09-19 DIAGNOSIS — J449 Chronic obstructive pulmonary disease, unspecified: Secondary | ICD-10-CM | POA: Diagnosis not present

## 2023-09-19 MED ORDER — ZEPBOUND 2.5 MG/0.5ML ~~LOC~~ SOAJ
2.5000 mg | SUBCUTANEOUS | 11 refills | Status: AC
  Filled 2023-09-19 – 2023-09-20 (×2): qty 2, 28d supply, fill #0
  Filled 2023-10-18: qty 2, 28d supply, fill #1

## 2023-09-20 ENCOUNTER — Other Ambulatory Visit (HOSPITAL_COMMUNITY): Payer: Self-pay

## 2023-10-18 ENCOUNTER — Other Ambulatory Visit (HOSPITAL_COMMUNITY): Payer: Self-pay

## 2023-10-18 DIAGNOSIS — M7542 Impingement syndrome of left shoulder: Secondary | ICD-10-CM | POA: Diagnosis not present

## 2023-10-23 ENCOUNTER — Ambulatory Visit
Admission: RE | Admit: 2023-10-23 | Discharge: 2023-10-23 | Disposition: A | Discharge status: Home / Self Care | Payer: BC Managed Care – PPO | Source: Ambulatory Visit | Attending: Acute Care | Admitting: Acute Care

## 2023-10-23 DIAGNOSIS — Z87891 Personal history of nicotine dependence: Secondary | ICD-10-CM

## 2023-10-23 DIAGNOSIS — Z122 Encounter for screening for malignant neoplasm of respiratory organs: Secondary | ICD-10-CM

## 2023-11-09 ENCOUNTER — Other Ambulatory Visit: Payer: Self-pay | Admitting: Acute Care

## 2023-11-09 DIAGNOSIS — Z87891 Personal history of nicotine dependence: Secondary | ICD-10-CM

## 2023-11-09 DIAGNOSIS — Z122 Encounter for screening for malignant neoplasm of respiratory organs: Secondary | ICD-10-CM

## 2023-11-10 DIAGNOSIS — M25512 Pain in left shoulder: Secondary | ICD-10-CM | POA: Diagnosis not present

## 2023-11-13 ENCOUNTER — Other Ambulatory Visit (HOSPITAL_COMMUNITY): Payer: Self-pay

## 2023-11-13 MED ORDER — ZEPBOUND 5 MG/0.5ML ~~LOC~~ SOAJ
5.0000 mg | SUBCUTANEOUS | 3 refills | Status: AC
  Filled 2023-11-13 – 2023-11-16 (×2): qty 2, 28d supply, fill #0
  Filled 2023-12-12 – 2023-12-15 (×2): qty 2, 28d supply, fill #1

## 2023-11-16 ENCOUNTER — Other Ambulatory Visit (HOSPITAL_COMMUNITY): Payer: Self-pay

## 2023-12-04 DIAGNOSIS — M7542 Impingement syndrome of left shoulder: Secondary | ICD-10-CM | POA: Diagnosis not present

## 2023-12-04 DIAGNOSIS — M1711 Unilateral primary osteoarthritis, right knee: Secondary | ICD-10-CM | POA: Diagnosis not present

## 2023-12-12 ENCOUNTER — Other Ambulatory Visit (HOSPITAL_COMMUNITY): Payer: Self-pay

## 2023-12-15 ENCOUNTER — Other Ambulatory Visit (HOSPITAL_COMMUNITY): Payer: Self-pay

## 2023-12-28 DIAGNOSIS — M25561 Pain in right knee: Secondary | ICD-10-CM | POA: Diagnosis not present

## 2024-01-10 ENCOUNTER — Other Ambulatory Visit (HOSPITAL_COMMUNITY): Payer: Self-pay

## 2024-01-10 MED ORDER — ZEPBOUND 7.5 MG/0.5ML ~~LOC~~ SOAJ
7.5000 mg | SUBCUTANEOUS | 3 refills | Status: AC
  Filled 2024-01-10 (×2): qty 2, 28d supply, fill #0
  Filled 2024-03-01: qty 2, 28d supply, fill #1

## 2024-01-10 MED ORDER — ZEPBOUND 7.5 MG/0.5ML ~~LOC~~ SOAJ
7.5000 mg | SUBCUTANEOUS | 3 refills | Status: AC
  Filled 2024-01-10 – 2024-02-03 (×2): qty 2, 28d supply, fill #0

## 2024-01-11 ENCOUNTER — Other Ambulatory Visit (HOSPITAL_COMMUNITY): Payer: Self-pay

## 2024-02-03 ENCOUNTER — Other Ambulatory Visit (HOSPITAL_BASED_OUTPATIENT_CLINIC_OR_DEPARTMENT_OTHER): Payer: Self-pay

## 2024-02-03 ENCOUNTER — Other Ambulatory Visit (HOSPITAL_COMMUNITY): Payer: Self-pay

## 2024-02-06 ENCOUNTER — Other Ambulatory Visit (HOSPITAL_COMMUNITY): Payer: Self-pay

## 2024-03-01 ENCOUNTER — Other Ambulatory Visit (HOSPITAL_COMMUNITY): Payer: Self-pay

## 2024-03-27 ENCOUNTER — Other Ambulatory Visit (HOSPITAL_COMMUNITY): Payer: Self-pay

## 2024-03-27 MED ORDER — ZEPBOUND 7.5 MG/0.5ML ~~LOC~~ SOAJ
7.5000 mg | SUBCUTANEOUS | 3 refills | Status: AC
  Filled 2024-03-27: qty 2, 28d supply, fill #0
  Filled 2024-04-22: qty 2, 28d supply, fill #1

## 2024-04-22 ENCOUNTER — Other Ambulatory Visit (HOSPITAL_COMMUNITY): Payer: Self-pay

## 2024-08-03 DIAGNOSIS — S82155A Nondisplaced fracture of left tibial tuberosity, initial encounter for closed fracture: Secondary | ICD-10-CM | POA: Diagnosis not present

## 2024-08-14 DIAGNOSIS — M25562 Pain in left knee: Secondary | ICD-10-CM | POA: Diagnosis not present

## 2024-09-27 DIAGNOSIS — K219 Gastro-esophageal reflux disease without esophagitis: Secondary | ICD-10-CM | POA: Diagnosis not present

## 2024-09-27 DIAGNOSIS — N401 Enlarged prostate with lower urinary tract symptoms: Secondary | ICD-10-CM | POA: Diagnosis not present

## 2024-10-04 DIAGNOSIS — Z23 Encounter for immunization: Secondary | ICD-10-CM | POA: Diagnosis not present

## 2024-10-04 DIAGNOSIS — K219 Gastro-esophageal reflux disease without esophagitis: Secondary | ICD-10-CM | POA: Diagnosis not present

## 2024-10-04 DIAGNOSIS — Z1331 Encounter for screening for depression: Secondary | ICD-10-CM | POA: Diagnosis not present

## 2024-10-04 DIAGNOSIS — Z0000 Encounter for general adult medical examination without abnormal findings: Secondary | ICD-10-CM | POA: Diagnosis not present

## 2024-10-04 DIAGNOSIS — Z1339 Encounter for screening examination for other mental health and behavioral disorders: Secondary | ICD-10-CM | POA: Diagnosis not present

## 2024-10-14 ENCOUNTER — Other Ambulatory Visit: Payer: Self-pay | Admitting: Acute Care

## 2024-10-14 DIAGNOSIS — Z87891 Personal history of nicotine dependence: Secondary | ICD-10-CM

## 2024-10-14 DIAGNOSIS — Z122 Encounter for screening for malignant neoplasm of respiratory organs: Secondary | ICD-10-CM

## 2024-10-29 ENCOUNTER — Other Ambulatory Visit

## 2024-11-29 ENCOUNTER — Ambulatory Visit
Admission: RE | Admit: 2024-11-29 | Discharge: 2024-11-29 | Disposition: A | Source: Ambulatory Visit | Attending: Acute Care | Admitting: Acute Care

## 2024-11-29 DIAGNOSIS — Z122 Encounter for screening for malignant neoplasm of respiratory organs: Secondary | ICD-10-CM

## 2024-11-29 DIAGNOSIS — Z87891 Personal history of nicotine dependence: Secondary | ICD-10-CM

## 2024-12-09 ENCOUNTER — Other Ambulatory Visit: Payer: Self-pay | Admitting: Acute Care

## 2024-12-09 DIAGNOSIS — Z122 Encounter for screening for malignant neoplasm of respiratory organs: Secondary | ICD-10-CM

## 2024-12-09 DIAGNOSIS — Z87891 Personal history of nicotine dependence: Secondary | ICD-10-CM
